# Patient Record
Sex: Female | Born: 1960 | Race: Black or African American | Hispanic: No | Marital: Married | Smoking: Never smoker
Health system: Southern US, Community
[De-identification: ages and names within clinical notes are randomized; demographics above are authoritative.]

## PROBLEM LIST (undated history)

## (undated) DIAGNOSIS — W19XXXA Unspecified fall, initial encounter: Secondary | ICD-10-CM

## (undated) DIAGNOSIS — E042 Nontoxic multinodular goiter: Secondary | ICD-10-CM

## (undated) DIAGNOSIS — J309 Allergic rhinitis, unspecified: Secondary | ICD-10-CM

## (undated) DIAGNOSIS — R079 Chest pain, unspecified: Secondary | ICD-10-CM

## (undated) DIAGNOSIS — M542 Cervicalgia: Secondary | ICD-10-CM

## (undated) DIAGNOSIS — S0003XA Contusion of scalp, initial encounter: Secondary | ICD-10-CM

## (undated) DIAGNOSIS — M1990 Unspecified osteoarthritis, unspecified site: Secondary | ICD-10-CM

## (undated) DIAGNOSIS — R21 Rash and other nonspecific skin eruption: Secondary | ICD-10-CM

## (undated) DIAGNOSIS — S1093XA Contusion of unspecified part of neck, initial encounter: Secondary | ICD-10-CM

## (undated) DIAGNOSIS — M79609 Pain in unspecified limb: Secondary | ICD-10-CM

## (undated) DIAGNOSIS — D219 Benign neoplasm of connective and other soft tissue, unspecified: Secondary | ICD-10-CM

## (undated) DIAGNOSIS — L299 Pruritus, unspecified: Secondary | ICD-10-CM

## (undated) DIAGNOSIS — S0083XA Contusion of other part of head, initial encounter: Secondary | ICD-10-CM

## (undated) DIAGNOSIS — M79606 Pain in leg, unspecified: Secondary | ICD-10-CM

## (undated) DIAGNOSIS — E039 Hypothyroidism, unspecified: Secondary | ICD-10-CM

## (undated) DIAGNOSIS — I10 Essential (primary) hypertension: Secondary | ICD-10-CM

## (undated) DIAGNOSIS — M545 Low back pain, unspecified: Secondary | ICD-10-CM

## (undated) DIAGNOSIS — E559 Vitamin D deficiency, unspecified: Secondary | ICD-10-CM

## (undated) HISTORY — DX: Low back pain, unspecified: M54.50

## (undated) HISTORY — DX: Allergic rhinitis, unspecified: J30.9

## (undated) HISTORY — DX: Benign neoplasm of connective and other soft tissue, unspecified: D21.9

## (undated) HISTORY — DX: Contusion of scalp, initial encounter: S10.93XA

## (undated) HISTORY — DX: Cervicalgia: M54.2

## (undated) HISTORY — DX: Pain in unspecified limb: M79.609

## (undated) HISTORY — DX: Pain in leg, unspecified: M79.606

## (undated) HISTORY — DX: Unspecified fall, initial encounter: W19.XXXA

## (undated) HISTORY — DX: Contusion of scalp, initial encounter: S00.03XA

## (undated) HISTORY — DX: Essential (primary) hypertension: I10

## (undated) HISTORY — DX: Vitamin D deficiency, unspecified: E55.9

## (undated) HISTORY — DX: Contusion of other part of head, initial encounter: S00.83XA

## (undated) HISTORY — DX: Chest pain, unspecified: R07.9

## (undated) HISTORY — DX: Unspecified osteoarthritis, unspecified site: M19.90

## (undated) HISTORY — DX: Nontoxic multinodular goiter: E04.2

## (undated) HISTORY — DX: Rash and other nonspecific skin eruption: R21

## (undated) HISTORY — DX: Hypothyroidism, unspecified: E03.9

## (undated) HISTORY — DX: Low back pain: M54.5

## (undated) HISTORY — DX: Pruritus, unspecified: L29.9

---

## 1997-03-25 HISTORY — PX: ABDOMINAL HYSTERECTOMY: SHX81

## 2003-06-25 ENCOUNTER — Emergency Department (HOSPITAL_COMMUNITY): Admission: AD | Admit: 2003-06-25 | Discharge: 2003-06-25 | Payer: Self-pay | Admitting: Internal Medicine

## 2004-09-20 ENCOUNTER — Ambulatory Visit: Payer: Self-pay | Admitting: Internal Medicine

## 2004-12-24 ENCOUNTER — Other Ambulatory Visit: Admission: RE | Admit: 2004-12-24 | Discharge: 2004-12-24 | Payer: Self-pay | Admitting: Obstetrics and Gynecology

## 2005-01-11 ENCOUNTER — Ambulatory Visit: Payer: Self-pay | Admitting: Internal Medicine

## 2005-01-15 ENCOUNTER — Ambulatory Visit: Payer: Self-pay | Admitting: Internal Medicine

## 2005-01-17 ENCOUNTER — Ambulatory Visit (HOSPITAL_COMMUNITY): Admission: RE | Admit: 2005-01-17 | Discharge: 2005-01-17 | Payer: Self-pay | Admitting: Internal Medicine

## 2005-03-19 ENCOUNTER — Ambulatory Visit: Payer: Self-pay | Admitting: Internal Medicine

## 2005-04-17 ENCOUNTER — Encounter: Admission: RE | Admit: 2005-04-17 | Discharge: 2005-04-17 | Payer: Self-pay | Admitting: Endocrinology

## 2005-04-17 ENCOUNTER — Encounter (INDEPENDENT_AMBULATORY_CARE_PROVIDER_SITE_OTHER): Payer: Self-pay | Admitting: *Deleted

## 2005-04-17 ENCOUNTER — Other Ambulatory Visit: Admission: RE | Admit: 2005-04-17 | Discharge: 2005-04-17 | Payer: Self-pay | Admitting: Diagnostic Radiology

## 2006-01-24 ENCOUNTER — Ambulatory Visit: Payer: Self-pay | Admitting: Internal Medicine

## 2006-05-26 ENCOUNTER — Ambulatory Visit: Payer: Self-pay | Admitting: Internal Medicine

## 2006-06-02 ENCOUNTER — Ambulatory Visit: Payer: Self-pay | Admitting: Internal Medicine

## 2006-06-02 LAB — CONVERTED CEMR LAB
ALT: 17 units/L (ref 0–40)
AST: 23 units/L (ref 0–37)
Albumin: 3.3 g/dL — ABNORMAL LOW (ref 3.5–5.2)
Alkaline Phosphatase: 54 units/L (ref 39–117)
Basophils Absolute: 0 10*3/uL (ref 0.0–0.1)
Basophils Relative: 0.1 % (ref 0.0–1.0)
Bilirubin Urine: NEGATIVE
Bilirubin, Direct: 0.1 mg/dL (ref 0.0–0.3)
Cholesterol: 165 mg/dL (ref 0–200)
Direct LDL: 98.4 mg/dL
Eosinophils Absolute: 0.1 10*3/uL (ref 0.0–0.6)
Eosinophils Relative: 2.6 % (ref 0.0–5.0)
HCT: 39.5 % (ref 36.0–46.0)
HDL: 52.6 mg/dL (ref >39.0)
Hemoglobin, Urine: NEGATIVE
Hemoglobin: 13.6 g/dL (ref 12.0–15.0)
Ketones, ur: NEGATIVE mg/dL
LDL Cholesterol: 107 mg/dL — ABNORMAL HIGH (ref 0–99)
Leukocytes, UA: NEGATIVE
Lymphocytes Relative: 35.4 % (ref 12.0–46.0)
MCHC: 34.4 g/dL (ref 30.0–36.0)
MCV: 87.8 fL (ref 78.0–100.0)
Monocytes Absolute: 0.5 10*3/uL (ref 0.2–0.7)
Monocytes Relative: 10.2 % (ref 3.0–11.0)
Neutro Abs: 2.8 10*3/uL (ref 1.4–7.7)
Neutrophils Relative %: 51.7 % (ref 43.0–77.0)
Nitrite: NEGATIVE
Platelets: 307 10*3/uL (ref 150–400)
RBC: 4.5 M/uL (ref 3.87–5.11)
RDW: 12.4 % (ref 11.5–14.6)
Specific Gravity, Urine: >=1.03 (ref 1.000–1.03)
Total Bilirubin: 0.5 mg/dL (ref 0.3–1.2)
Total CHOL/HDL Ratio: 3.1
Total Protein, Urine: NEGATIVE mg/dL
Total Protein: 6.5 g/dL (ref 6.0–8.3)
Triglycerides: 29 mg/dL (ref 0–149)
Urine Glucose: NEGATIVE mg/dL
Urobilinogen, UA: 0.2 (ref 0.0–1.0)
VLDL: 6 mg/dL (ref 0–40)
WBC: 5.3 10*3/uL (ref 4.5–10.5)
pH: 5.5 (ref 5.0–8.0)

## 2006-08-06 ENCOUNTER — Other Ambulatory Visit: Admission: RE | Admit: 2006-08-06 | Discharge: 2006-08-06 | Payer: Self-pay | Admitting: Obstetrics & Gynecology

## 2006-09-29 ENCOUNTER — Encounter: Admission: RE | Admit: 2006-09-29 | Discharge: 2006-09-29 | Payer: Self-pay | Admitting: Internal Medicine

## 2006-10-02 ENCOUNTER — Encounter: Admission: RE | Admit: 2006-10-02 | Discharge: 2006-10-02 | Payer: Self-pay | Admitting: Internal Medicine

## 2006-10-08 ENCOUNTER — Ambulatory Visit: Payer: Self-pay | Admitting: Internal Medicine

## 2006-11-10 ENCOUNTER — Ambulatory Visit: Payer: Self-pay | Admitting: Internal Medicine

## 2007-02-10 ENCOUNTER — Encounter: Payer: Self-pay | Admitting: Internal Medicine

## 2007-02-10 DIAGNOSIS — E042 Nontoxic multinodular goiter: Secondary | ICD-10-CM | POA: Insufficient documentation

## 2007-02-10 DIAGNOSIS — M542 Cervicalgia: Secondary | ICD-10-CM | POA: Insufficient documentation

## 2007-02-11 ENCOUNTER — Ambulatory Visit: Payer: Self-pay | Admitting: Internal Medicine

## 2007-02-11 LAB — CONVERTED CEMR LAB
AST: 19 units/L (ref 0–37)
Albumin: 4 g/dL (ref 3.5–5.2)
Basophils Absolute: 0 10*3/uL (ref 0.0–0.1)
Bilirubin, Direct: 0.1 mg/dL (ref 0.0–0.3)
Chloride: 108 meq/L (ref 96–112)
Cholesterol: 195 mg/dL (ref 0–200)
Eosinophils Absolute: 0.1 10*3/uL (ref 0.0–0.6)
Eosinophils Relative: 2.6 % (ref 0.0–5.0)
GFR calc Af Amer: 137 mL/min
GFR calc non Af Amer: 113 mL/min
Glucose, Bld: 93 mg/dL (ref 70–99)
HCT: 41.8 % (ref 36.0–46.0)
Lymphocytes Relative: 39 % (ref 12.0–46.0)
MCHC: 33.8 g/dL (ref 30.0–36.0)
MCV: 89.3 fL (ref 78.0–100.0)
Monocytes Absolute: 0.5 10*3/uL (ref 0.2–0.7)
Neutro Abs: 2.7 10*3/uL (ref 1.4–7.7)
Neutrophils Relative %: 49 % (ref 43.0–77.0)
Potassium: 4.1 meq/L (ref 3.5–5.1)
RBC: 4.69 M/uL (ref 3.87–5.11)
Sodium: 140 meq/L (ref 135–145)
TSH: 0.57 microintl units/mL (ref 0.35–5.50)
Total CHOL/HDL Ratio: 3
WBC: 5.4 10*3/uL (ref 4.5–10.5)

## 2007-04-02 ENCOUNTER — Encounter: Payer: Self-pay | Admitting: Internal Medicine

## 2007-04-03 ENCOUNTER — Ambulatory Visit: Payer: Self-pay | Admitting: Internal Medicine

## 2007-04-03 DIAGNOSIS — M79609 Pain in unspecified limb: Secondary | ICD-10-CM

## 2007-04-03 DIAGNOSIS — R079 Chest pain, unspecified: Secondary | ICD-10-CM

## 2007-04-03 DIAGNOSIS — E559 Vitamin D deficiency, unspecified: Secondary | ICD-10-CM

## 2007-05-08 ENCOUNTER — Telehealth: Payer: Self-pay | Admitting: Internal Medicine

## 2007-05-22 ENCOUNTER — Ambulatory Visit: Payer: Self-pay | Admitting: Internal Medicine

## 2007-06-13 ENCOUNTER — Emergency Department (HOSPITAL_COMMUNITY): Admission: EM | Admit: 2007-06-13 | Discharge: 2007-06-13 | Payer: Self-pay | Admitting: Family Medicine

## 2007-06-26 ENCOUNTER — Telehealth: Payer: Self-pay | Admitting: Internal Medicine

## 2007-06-29 ENCOUNTER — Telehealth: Payer: Self-pay | Admitting: Internal Medicine

## 2007-08-11 ENCOUNTER — Other Ambulatory Visit: Admission: RE | Admit: 2007-08-11 | Discharge: 2007-08-11 | Payer: Self-pay | Admitting: Obstetrics & Gynecology

## 2007-08-14 ENCOUNTER — Ambulatory Visit: Payer: Self-pay | Admitting: Internal Medicine

## 2007-08-14 DIAGNOSIS — L299 Pruritus, unspecified: Secondary | ICD-10-CM | POA: Insufficient documentation

## 2007-08-14 DIAGNOSIS — J309 Allergic rhinitis, unspecified: Secondary | ICD-10-CM | POA: Insufficient documentation

## 2007-08-14 DIAGNOSIS — R21 Rash and other nonspecific skin eruption: Secondary | ICD-10-CM | POA: Insufficient documentation

## 2007-10-13 ENCOUNTER — Telehealth: Payer: Self-pay | Admitting: Internal Medicine

## 2007-11-16 ENCOUNTER — Ambulatory Visit: Payer: Self-pay | Admitting: Internal Medicine

## 2007-11-18 ENCOUNTER — Telehealth: Payer: Self-pay | Admitting: Internal Medicine

## 2007-11-19 ENCOUNTER — Ambulatory Visit: Payer: Self-pay | Admitting: Internal Medicine

## 2007-11-19 DIAGNOSIS — M545 Low back pain, unspecified: Secondary | ICD-10-CM | POA: Insufficient documentation

## 2007-11-19 DIAGNOSIS — S0003XA Contusion of scalp, initial encounter: Secondary | ICD-10-CM | POA: Insufficient documentation

## 2007-11-19 DIAGNOSIS — S0083XA Contusion of other part of head, initial encounter: Secondary | ICD-10-CM

## 2007-11-19 DIAGNOSIS — S1093XA Contusion of unspecified part of neck, initial encounter: Secondary | ICD-10-CM

## 2008-02-11 ENCOUNTER — Telehealth: Payer: Self-pay | Admitting: Internal Medicine

## 2008-02-17 ENCOUNTER — Encounter: Admission: RE | Admit: 2008-02-17 | Discharge: 2008-02-17 | Payer: Self-pay | Admitting: Internal Medicine

## 2008-02-26 ENCOUNTER — Telehealth: Payer: Self-pay | Admitting: Internal Medicine

## 2008-02-29 ENCOUNTER — Ambulatory Visit: Payer: Self-pay | Admitting: Internal Medicine

## 2008-03-01 LAB — CONVERTED CEMR LAB
ALT: 17 units/L (ref 0–35)
AST: 22 units/L (ref 0–37)
BUN: 11 mg/dL (ref 6–23)
Basophils Absolute: 0 10*3/uL (ref 0.0–0.1)
Basophils Absolute: 0 10*3/uL (ref 0.0–0.1)
Basophils Relative: 0.2 % (ref 0.0–3.0)
Basophils Relative: 0.2 % (ref 0.0–3.0)
CO2: 27 meq/L (ref 19–32)
CO2: 27 meq/L (ref 19–32)
Chloride: 109 meq/L (ref 96–112)
Chloride: 109 meq/L (ref 96–112)
Creatinine, Ser: 0.6 mg/dL (ref 0.4–1.2)
Eosinophils Relative: 2.8 % (ref 0.0–5.0)
Glucose, Bld: 90 mg/dL (ref 70–99)
Glucose, Bld: 90 mg/dL (ref 70–99)
Hemoglobin: 13.9 g/dL (ref 12.0–15.0)
Ketones, ur: NEGATIVE mg/dL
Leukocytes, UA: NEGATIVE
Leukocytes, UA: NEGATIVE
Lipase: 30 units/L (ref 11.0–59.0)
Lymphocytes Relative: 34.2 % (ref 12.0–46.0)
Lymphocytes Relative: 34.2 % (ref 12.0–46.0)
MCHC: 34.1 g/dL (ref 30.0–36.0)
Monocytes Relative: 10.8 % (ref 3.0–12.0)
Monocytes Relative: 10.8 % (ref 3.0–12.0)
Neutrophils Relative %: 52 % (ref 43.0–77.0)
Neutrophils Relative %: 52 % (ref 43.0–77.0)
Nitrite: NEGATIVE
Nitrite: NEGATIVE
RBC: 4.69 M/uL (ref 3.87–5.11)
RBC: 4.69 M/uL (ref 3.87–5.11)
Sed Rate: 10 mm/hr (ref 0–22)
Sed Rate: 10 mm/hr (ref 0–22)
Sodium: 142 meq/L (ref 135–145)
Specific Gravity, Urine: 1.02 (ref 1.000–1.03)
Specific Gravity, Urine: 1.02 (ref 1.000–1.03)
Total Bilirubin: 0.7 mg/dL (ref 0.3–1.2)
Urobilinogen, UA: 0.2 (ref 0.0–1.0)
Urobilinogen, UA: 0.2 (ref 0.0–1.0)
WBC: 5.6 10*3/uL (ref 4.5–10.5)
pH: 6 (ref 5.0–8.0)
pH: 6 (ref 5.0–8.0)

## 2008-03-05 ENCOUNTER — Encounter: Payer: Self-pay | Admitting: Internal Medicine

## 2008-03-07 ENCOUNTER — Ambulatory Visit: Payer: Self-pay | Admitting: Endocrinology

## 2008-03-08 ENCOUNTER — Ambulatory Visit: Payer: Self-pay | Admitting: Endocrinology

## 2008-03-15 ENCOUNTER — Encounter (HOSPITAL_COMMUNITY): Admission: RE | Admit: 2008-03-15 | Discharge: 2008-03-22 | Payer: Self-pay | Admitting: Internal Medicine

## 2008-06-13 ENCOUNTER — Telehealth: Payer: Self-pay | Admitting: Internal Medicine

## 2008-06-17 ENCOUNTER — Encounter (INDEPENDENT_AMBULATORY_CARE_PROVIDER_SITE_OTHER): Payer: Self-pay | Admitting: Interventional Radiology

## 2008-06-17 ENCOUNTER — Encounter: Payer: Self-pay | Admitting: Endocrinology

## 2008-06-17 ENCOUNTER — Ambulatory Visit (HOSPITAL_COMMUNITY): Admission: RE | Admit: 2008-06-17 | Discharge: 2008-06-17 | Payer: Self-pay | Admitting: Endocrinology

## 2008-06-28 ENCOUNTER — Telehealth: Payer: Self-pay | Admitting: Endocrinology

## 2008-06-29 ENCOUNTER — Telehealth: Payer: Self-pay | Admitting: Endocrinology

## 2008-07-04 ENCOUNTER — Telehealth (INDEPENDENT_AMBULATORY_CARE_PROVIDER_SITE_OTHER): Payer: Self-pay | Admitting: *Deleted

## 2008-07-09 IMAGING — CR DG ABDOMEN ACUTE W/ 1V CHEST
3 series · 3 of 3 positions shown · non-contrast
Comparison: none

CLINICAL DATA: Chest pain.  Abdominal pain.  
ACUTE ABDOMINAL SERIES ? 3 VIEW:

[view not recorded (1 of 3)]
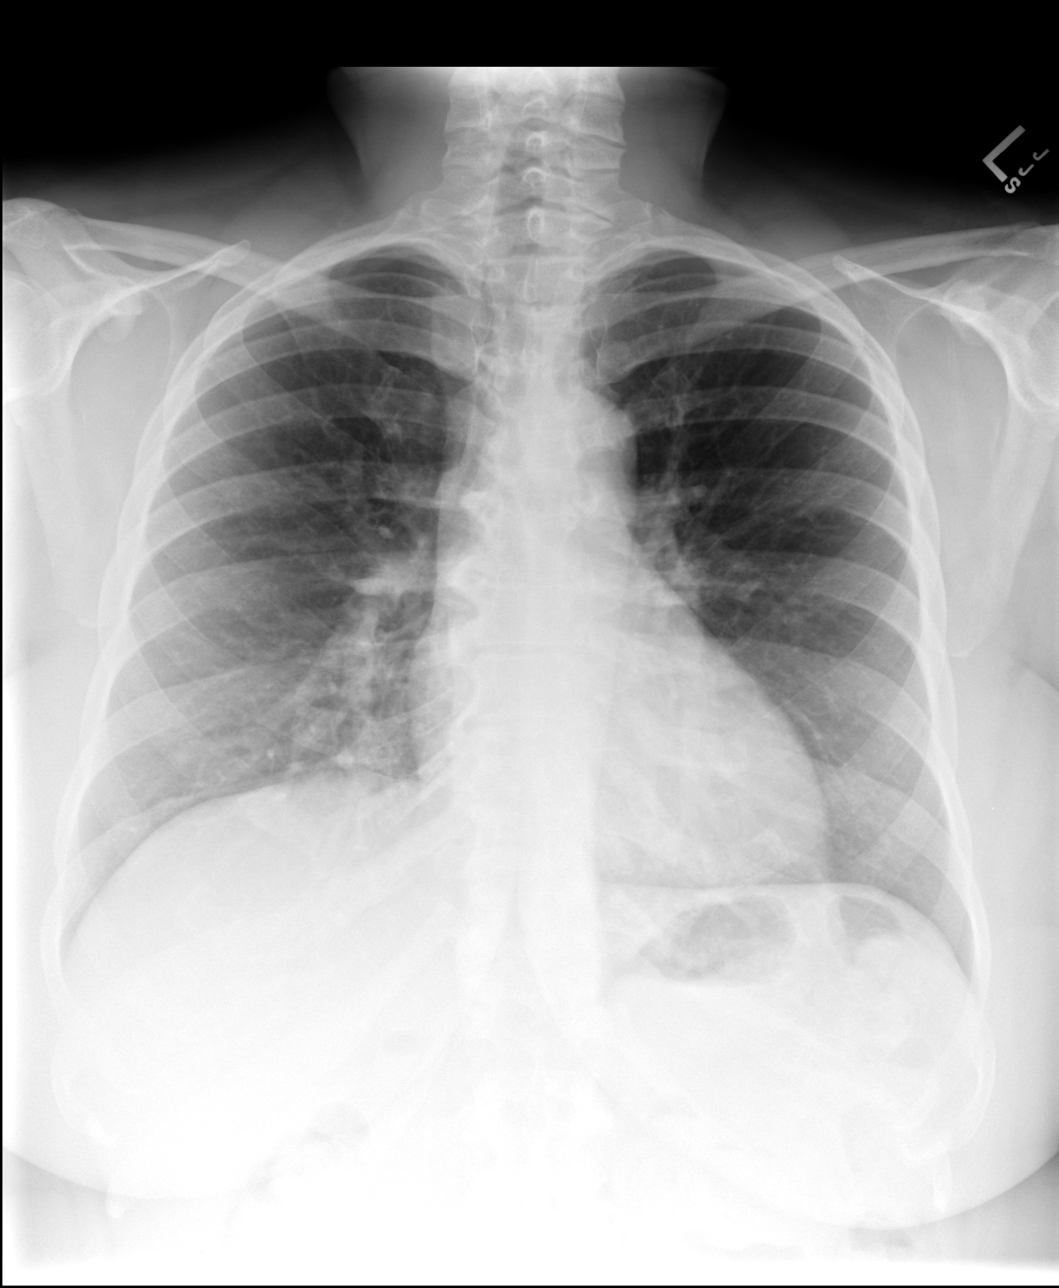

[view not recorded (2 of 3)]
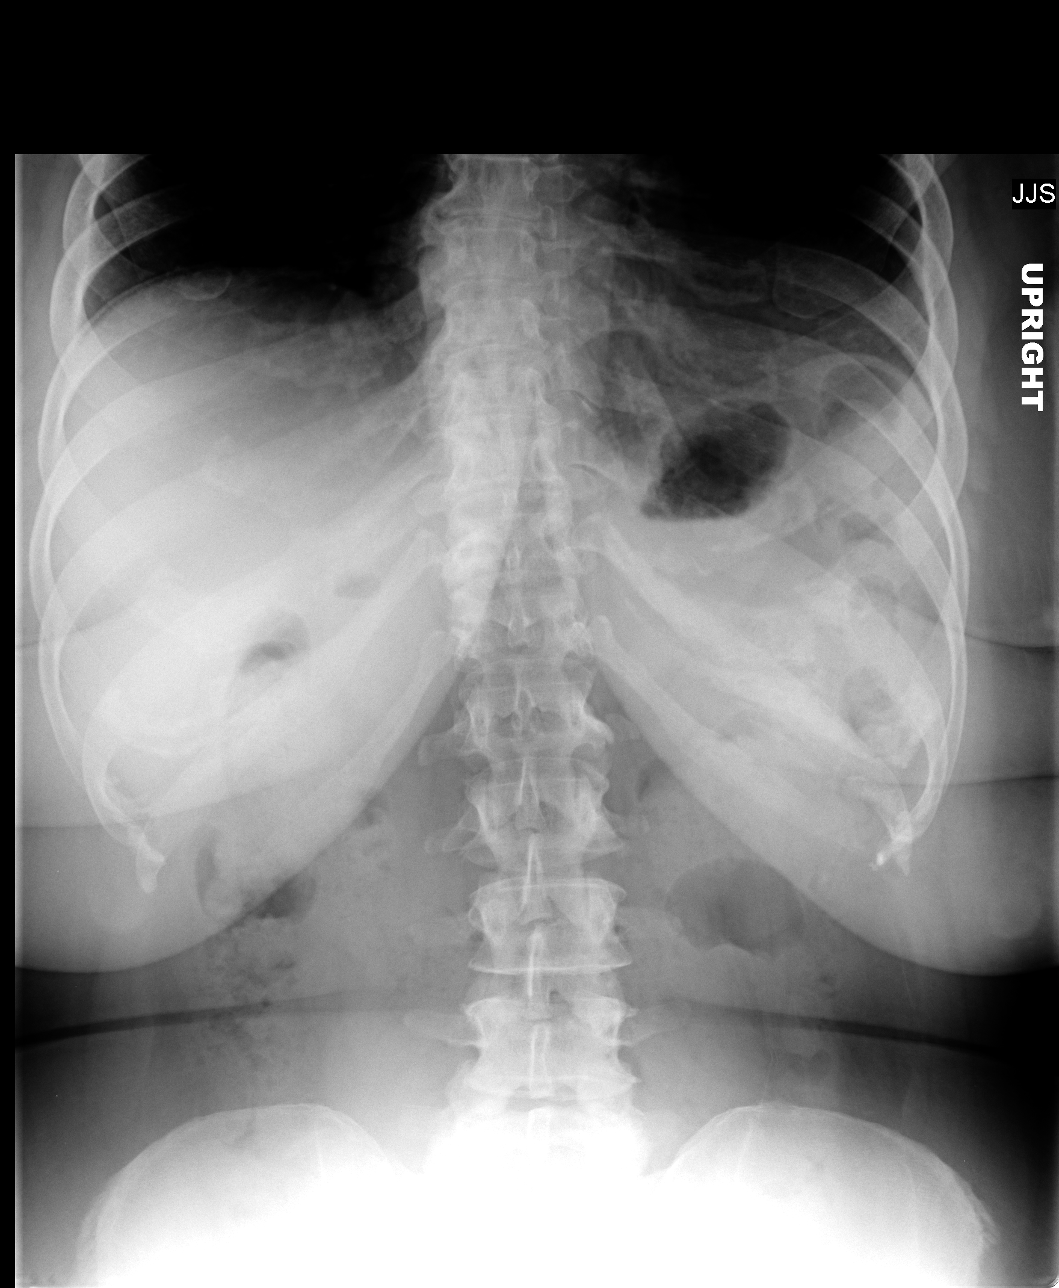

[view not recorded (3 of 3)]
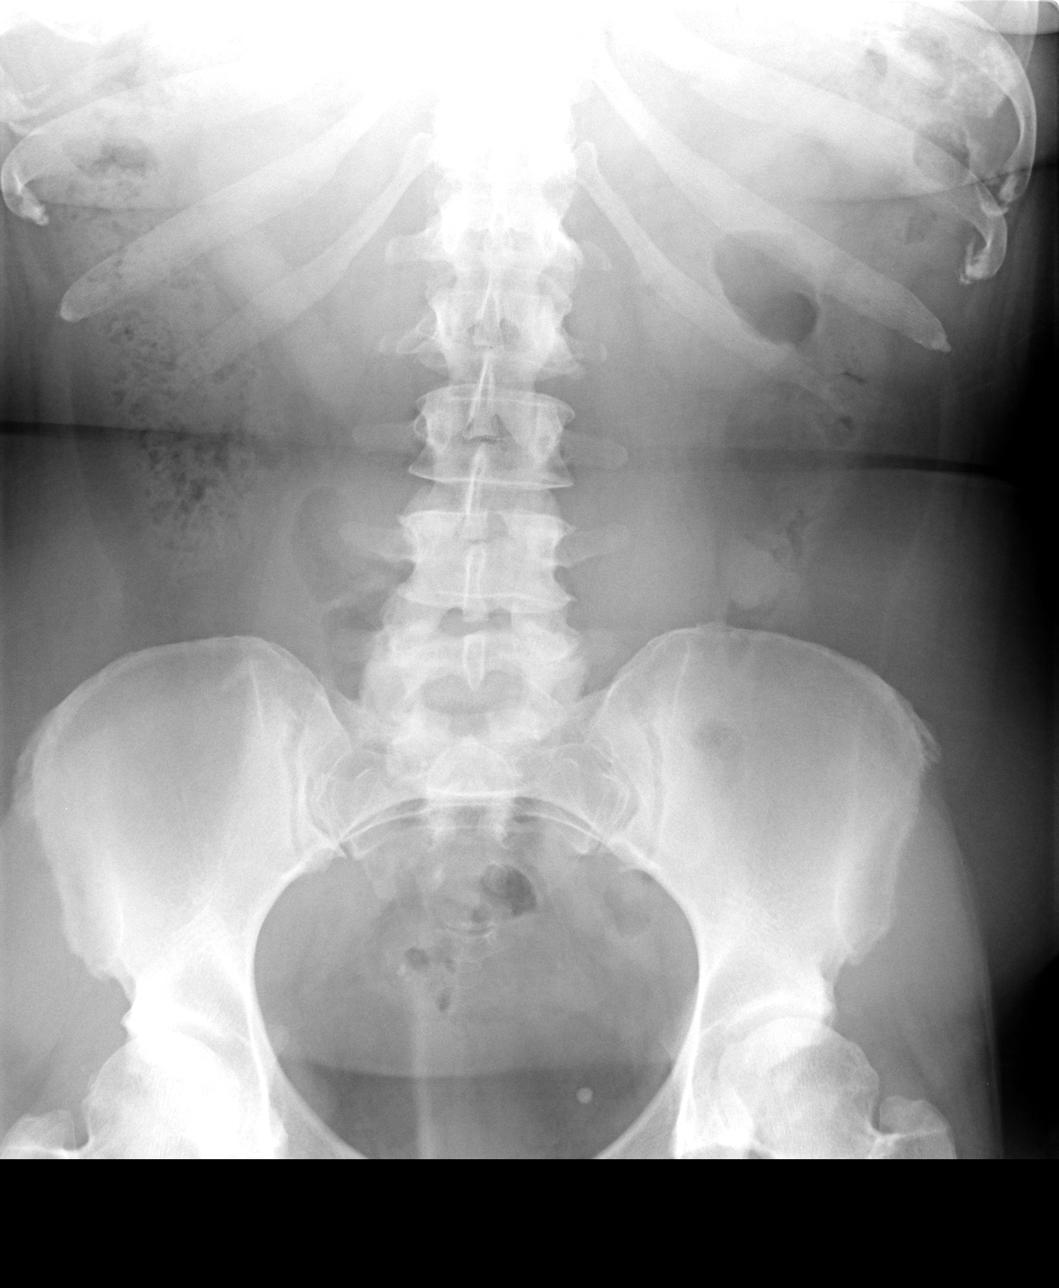

[3 of 3 positions shown; findings below may reference images not displayed]

FINDINGS: There is no evidence of active cardiopulmonary disease.  Fairly generous amount of stool in the colon.  No evidence of ileus or bowel obstruction.  Psoas muscle margins and properitoneal fat stripes are defined.  No unusual calcifications.  Incidental degenerative spondylotic changes involving the lower thoracic spine.  Minimal lower thoracic dextroscoliosis.
IMPRESSION: No acute chest or abdominal disease.  See comments above.

## 2008-08-05 ENCOUNTER — Encounter (HOSPITAL_COMMUNITY): Admission: RE | Admit: 2008-08-05 | Discharge: 2008-10-26 | Payer: Self-pay | Admitting: Endocrinology

## 2008-08-18 ENCOUNTER — Telehealth (INDEPENDENT_AMBULATORY_CARE_PROVIDER_SITE_OTHER): Payer: Self-pay | Admitting: *Deleted

## 2008-08-25 ENCOUNTER — Ambulatory Visit: Payer: Self-pay | Admitting: Internal Medicine

## 2008-08-25 ENCOUNTER — Encounter: Payer: Self-pay | Admitting: Internal Medicine

## 2008-08-25 LAB — CONVERTED CEMR LAB
ALT: 18 units/L (ref 0–35)
Alkaline Phosphatase: 79 units/L (ref 39–117)
BUN: 11 mg/dL (ref 6–23)
Basophils Absolute: 0 10*3/uL (ref 0.0–0.1)
Bilirubin Urine: NEGATIVE
Bilirubin, Direct: 0.1 mg/dL (ref 0.0–0.3)
Cholesterol: 192 mg/dL (ref 0–200)
Eosinophils Absolute: 0.1 10*3/uL (ref 0.0–0.7)
GFR calc non Af Amer: 136.1 mL/min (ref >60)
Glucose, Bld: 87 mg/dL (ref 70–99)
HCT: 41 % (ref 36.0–46.0)
Hemoglobin, Urine: NEGATIVE
Lymphs Abs: 1.5 10*3/uL (ref 0.7–4.0)
MCV: 86 fL (ref 78.0–100.0)
Monocytes Absolute: 0.4 10*3/uL (ref 0.1–1.0)
Monocytes Relative: 9.5 % (ref 3.0–12.0)
Platelets: 297 10*3/uL (ref 150.0–400.0)
Potassium: 4.2 meq/L (ref 3.5–5.1)
RDW: 12.9 % (ref 11.5–14.6)
Total Protein, Urine: NEGATIVE mg/dL
Total Protein: 7.1 g/dL (ref 6.0–8.3)
Urine Glucose: NEGATIVE mg/dL

## 2008-09-30 ENCOUNTER — Ambulatory Visit: Payer: Self-pay | Admitting: Internal Medicine

## 2008-09-30 ENCOUNTER — Ambulatory Visit: Payer: Self-pay | Admitting: Endocrinology

## 2008-09-30 DIAGNOSIS — R51 Headache: Secondary | ICD-10-CM

## 2008-09-30 DIAGNOSIS — R519 Headache, unspecified: Secondary | ICD-10-CM | POA: Insufficient documentation

## 2008-10-07 ENCOUNTER — Encounter: Payer: Self-pay | Admitting: Cardiology

## 2008-10-10 LAB — CONVERTED CEMR LAB: TSH: 0.56 microintl units/mL (ref 0.35–5.50)

## 2008-10-12 ENCOUNTER — Encounter: Payer: Self-pay | Admitting: Internal Medicine

## 2008-11-03 ENCOUNTER — Telehealth: Payer: Self-pay | Admitting: Internal Medicine

## 2009-01-05 ENCOUNTER — Ambulatory Visit: Payer: Self-pay | Admitting: Endocrinology

## 2009-01-05 DIAGNOSIS — E0590 Thyrotoxicosis, unspecified without thyrotoxic crisis or storm: Secondary | ICD-10-CM | POA: Insufficient documentation

## 2009-01-06 ENCOUNTER — Telehealth: Payer: Self-pay | Admitting: Internal Medicine

## 2009-01-11 ENCOUNTER — Ambulatory Visit: Payer: Self-pay | Admitting: Endocrinology

## 2009-01-12 LAB — CONVERTED CEMR LAB: TSH: 0.61 microintl units/mL (ref 0.35–5.50)

## 2009-03-13 ENCOUNTER — Encounter: Payer: Self-pay | Admitting: Internal Medicine

## 2009-03-25 DIAGNOSIS — I10 Essential (primary) hypertension: Secondary | ICD-10-CM

## 2009-03-25 DIAGNOSIS — E039 Hypothyroidism, unspecified: Secondary | ICD-10-CM

## 2009-03-25 HISTORY — DX: Hypothyroidism, unspecified: E03.9

## 2009-03-25 HISTORY — DX: Essential (primary) hypertension: I10

## 2009-05-05 ENCOUNTER — Telehealth: Payer: Self-pay | Admitting: Internal Medicine

## 2009-05-05 ENCOUNTER — Ambulatory Visit: Payer: Self-pay | Admitting: Endocrinology

## 2009-05-16 ENCOUNTER — Ambulatory Visit: Payer: Self-pay | Admitting: Internal Medicine

## 2009-05-18 LAB — CONVERTED CEMR LAB
ALT: 65 units/L — ABNORMAL HIGH (ref 0–35)
AST: 57 units/L — ABNORMAL HIGH (ref 0–37)
Alkaline Phosphatase: 164 units/L — ABNORMAL HIGH (ref 39–117)
Bilirubin, Direct: 0.2 mg/dL (ref 0.0–0.3)
CO2: 28 meq/L (ref 19–32)
Chloride: 108 meq/L (ref 96–112)
Creatinine, Ser: 0.4 mg/dL (ref 0.4–1.2)
Eosinophils Relative: 1.7 % (ref 0.0–5.0)
HCT: 37.5 % (ref 36.0–46.0)
Hemoglobin: 12.5 g/dL (ref 12.0–15.0)
LDL Cholesterol: 86 mg/dL (ref 0–99)
Leukocytes, UA: NEGATIVE
Lymphocytes Relative: 39.8 % (ref 12.0–46.0)
Lymphs Abs: 1.2 10*3/uL (ref 0.7–4.0)
Monocytes Relative: 21.7 % — ABNORMAL HIGH (ref 3.0–12.0)
Nitrite: NEGATIVE
Platelets: 305 10*3/uL (ref 150.0–400.0)
Potassium: 4.2 meq/L (ref 3.5–5.1)
Specific Gravity, Urine: <=1.005 (ref 1.000–1.030)
TSH: 0.12 microintl units/mL — ABNORMAL LOW (ref 0.35–5.50)
Total CHOL/HDL Ratio: 3
Total Protein: 6.9 g/dL (ref 6.0–8.3)
Triglycerides: 101 mg/dL (ref 0.0–149.0)
Urobilinogen, UA: 0.2 (ref 0.0–1.0)
WBC: 3.2 10*3/uL — ABNORMAL LOW (ref 4.5–10.5)
pH: 5.5 (ref 5.0–8.0)

## 2009-05-19 ENCOUNTER — Telehealth: Payer: Self-pay | Admitting: Internal Medicine

## 2009-05-19 ENCOUNTER — Telehealth: Payer: Self-pay | Admitting: Endocrinology

## 2009-06-08 ENCOUNTER — Encounter (HOSPITAL_COMMUNITY): Admission: RE | Admit: 2009-06-08 | Discharge: 2009-09-06 | Payer: Self-pay | Admitting: Endocrinology

## 2009-06-08 ENCOUNTER — Telehealth: Payer: Self-pay | Admitting: Endocrinology

## 2009-06-12 ENCOUNTER — Telehealth: Payer: Self-pay | Admitting: Endocrinology

## 2009-06-30 ENCOUNTER — Ambulatory Visit (HOSPITAL_COMMUNITY): Admission: RE | Admit: 2009-06-30 | Discharge: 2009-06-30 | Payer: Self-pay | Admitting: Endocrinology

## 2009-08-17 ENCOUNTER — Ambulatory Visit: Payer: Self-pay | Admitting: Endocrinology

## 2009-08-17 DIAGNOSIS — K769 Liver disease, unspecified: Secondary | ICD-10-CM | POA: Insufficient documentation

## 2009-08-25 ENCOUNTER — Ambulatory Visit: Payer: Self-pay | Admitting: Endocrinology

## 2009-08-25 ENCOUNTER — Telehealth: Payer: Self-pay | Admitting: Internal Medicine

## 2009-08-25 LAB — CONVERTED CEMR LAB
Alkaline Phosphatase: 187 units/L — ABNORMAL HIGH (ref 39–117)
Bilirubin, Direct: 0.1 mg/dL (ref 0.0–0.3)

## 2009-09-13 ENCOUNTER — Telehealth: Payer: Self-pay | Admitting: Internal Medicine

## 2009-10-16 ENCOUNTER — Ambulatory Visit: Payer: Self-pay | Admitting: Internal Medicine

## 2009-10-16 DIAGNOSIS — R030 Elevated blood-pressure reading, without diagnosis of hypertension: Secondary | ICD-10-CM

## 2009-10-16 DIAGNOSIS — G4700 Insomnia, unspecified: Secondary | ICD-10-CM

## 2009-10-16 DIAGNOSIS — F341 Dysthymic disorder: Secondary | ICD-10-CM

## 2009-10-31 ENCOUNTER — Telehealth: Payer: Self-pay | Admitting: Internal Medicine

## 2009-10-31 ENCOUNTER — Ambulatory Visit: Payer: Self-pay | Admitting: Internal Medicine

## 2009-11-16 ENCOUNTER — Ambulatory Visit: Payer: Self-pay | Admitting: Internal Medicine

## 2009-11-30 ENCOUNTER — Telehealth: Payer: Self-pay | Admitting: Internal Medicine

## 2009-11-30 ENCOUNTER — Ambulatory Visit: Payer: Self-pay | Admitting: Internal Medicine

## 2009-12-07 ENCOUNTER — Encounter: Payer: Self-pay | Admitting: Internal Medicine

## 2009-12-07 ENCOUNTER — Telehealth: Payer: Self-pay | Admitting: Internal Medicine

## 2009-12-12 ENCOUNTER — Telehealth: Payer: Self-pay | Admitting: Internal Medicine

## 2009-12-15 ENCOUNTER — Ambulatory Visit: Payer: Self-pay | Admitting: Internal Medicine

## 2010-02-08 ENCOUNTER — Ambulatory Visit: Payer: Self-pay | Admitting: Endocrinology

## 2010-03-01 ENCOUNTER — Encounter: Payer: Self-pay | Admitting: Internal Medicine

## 2010-03-01 ENCOUNTER — Ambulatory Visit: Payer: Self-pay | Admitting: Internal Medicine

## 2010-03-02 LAB — CONVERTED CEMR LAB: Vit D, 25-Hydroxy: 14 ng/mL — ABNORMAL LOW (ref 30–89)

## 2010-03-06 ENCOUNTER — Telehealth: Payer: Self-pay | Admitting: Internal Medicine

## 2010-03-06 LAB — CONVERTED CEMR LAB
ALT: 15 units/L (ref 0–35)
AST: 20 units/L (ref 0–37)
Albumin: 4 g/dL (ref 3.5–5.2)
Alkaline Phosphatase: 109 units/L (ref 39–117)
BUN: 11 mg/dL (ref 6–23)
Basophils Absolute: 0 10*3/uL (ref 0.0–0.1)
Basophils Relative: 0.4 % (ref 0.0–3.0)
Bilirubin Urine: NEGATIVE
Bilirubin, Direct: 0.1 mg/dL (ref 0.0–0.3)
CO2: 29 meq/L (ref 19–32)
Calcium: 9.3 mg/dL (ref 8.4–10.5)
Chloride: 110 meq/L (ref 96–112)
Creatinine, Ser: 0.6 mg/dL (ref 0.4–1.2)
Eosinophils Absolute: 0.1 10*3/uL (ref 0.0–0.7)
Eosinophils Relative: 2 % (ref 0.0–5.0)
GFR calc non Af Amer: 132.72 mL/min (ref >60.00)
Glucose, Bld: 85 mg/dL (ref 70–99)
HCT: 38.4 % (ref 36.0–46.0)
Hemoglobin: 13.1 g/dL (ref 12.0–15.0)
Ketones, ur: NEGATIVE mg/dL
Leukocytes, UA: NEGATIVE
Lymphocytes Relative: 38 % (ref 12.0–46.0)
Lymphs Abs: 1.7 10*3/uL (ref 0.7–4.0)
MCHC: 34.1 g/dL (ref 30.0–36.0)
MCV: 89.2 fL (ref 78.0–100.0)
Monocytes Absolute: 0.4 10*3/uL (ref 0.1–1.0)
Monocytes Relative: 8 % (ref 3.0–12.0)
Neutro Abs: 2.4 10*3/uL (ref 1.4–7.7)
Neutrophils Relative %: 51.6 % (ref 43.0–77.0)
Nitrite: NEGATIVE
Platelets: 272 10*3/uL (ref 150.0–400.0)
Potassium: 4.8 meq/L (ref 3.5–5.1)
RBC: 4.31 M/uL (ref 3.87–5.11)
RDW: 14.2 % (ref 11.5–14.6)
Sodium: 146 meq/L — ABNORMAL HIGH (ref 135–145)
Specific Gravity, Urine: 1.015 (ref 1.000–1.030)
TSH: 15.69 microintl units/mL — ABNORMAL HIGH (ref 0.35–5.50)
Total Bilirubin: 0.8 mg/dL (ref 0.3–1.2)
Total Protein, Urine: NEGATIVE mg/dL
Total Protein: 6.8 g/dL (ref 6.0–8.3)
Urine Glucose: NEGATIVE mg/dL
Urobilinogen, UA: 0.2 (ref 0.0–1.0)
WBC: 4.6 10*3/uL (ref 4.5–10.5)
pH: 7 (ref 5.0–8.0)

## 2010-03-13 ENCOUNTER — Ambulatory Visit: Payer: Self-pay | Admitting: Internal Medicine

## 2010-03-13 DIAGNOSIS — E039 Hypothyroidism, unspecified: Secondary | ICD-10-CM | POA: Insufficient documentation

## 2010-03-13 DIAGNOSIS — R609 Edema, unspecified: Secondary | ICD-10-CM | POA: Insufficient documentation

## 2010-03-22 ENCOUNTER — Encounter: Payer: Self-pay | Admitting: Internal Medicine

## 2010-04-10 ENCOUNTER — Encounter: Payer: Self-pay | Admitting: Internal Medicine

## 2010-04-13 ENCOUNTER — Ambulatory Visit
Admission: RE | Admit: 2010-04-13 | Discharge: 2010-04-13 | Payer: Self-pay | Source: Home / Self Care | Attending: Internal Medicine | Admitting: Internal Medicine

## 2010-04-13 DIAGNOSIS — I10 Essential (primary) hypertension: Secondary | ICD-10-CM | POA: Insufficient documentation

## 2010-04-16 ENCOUNTER — Encounter: Payer: Self-pay | Admitting: Internal Medicine

## 2010-04-24 NOTE — Progress Notes (Signed)
----   Converted from flag ---- ---- 06/12/2009 9:57 AM, Shelbie Proctor wrote: Dr Lorane Gell pt wanted to know why she need this again (1-131Therapy) pt states she had this done . is this a repeat  pls advise ------------------------------  please call pt.  yes, this is a repeat, which approx 1/10 people need Jini Horiuchi, md  Appended Document:  Attempted to call patient, no answer/no machine.  Appended Document:  I informed pt of above, but pt wants to know from MD the specific reason she needs to repeat this as she had done it 4 days ago. please advise  Appended Document:  i called pt 06/13/09 please do i-131 rx,a nd ret 6 weeks later

## 2010-04-24 NOTE — Miscellaneous (Signed)
Summary: Orders Update  Clinical Lists Changes  Orders: Added new Test order of TLB-CBC Platelet - w/Differential (85025-CBCD) - Signed Added new Test order of TLB-TSH (Thyroid Stimulating Hormone) (84443-TSH) - Signed Added new Test order of TLB-BMP (Basic Metabolic Panel-BMET) (80048-METABOL) - Signed Added new Test order of TLB-Hepatic/Liver Function Pnl (80076-HEPATIC) - Signed Added new Test order of TLB-Lipid Panel (80061-LIPID) - Signed Added new Test order of TLB-Udip ONLY (81003-UDIP) - Signed

## 2010-04-24 NOTE — Letter (Signed)
Summary: Out of Work  [facility]  [address] [place], [place] [address]   Phone: [phone]  Fax: [phone]    November 30, 2009   Employee:  [name]    To Whom It May Concern:   For Medical reasons, please excuse the above named employee from work for the following dates:  Start:   10/31/2009  End:   12/08/2009  If you need additional information, please feel free to contact our office.         Sincerely,    Lanier Prude, [facility])

## 2010-04-24 NOTE — Progress Notes (Signed)
Summary: Sinus infection  Phone Note Other Incoming   Caller: pt - [phone] Summary of Call: Pt has a terrible sinus infection. she [facility] [address]. Please Advise Initial call taken by: Ami Bullins CMA,  September 13, 2009 9:06 AM  Follow-up for Phone Call        Zpac Use over-the-counter medicines for "cold": Tylenol  650mg  or Advil 400mg  every 6 hours  for fever; Delsym or Robutussin for cough. Mucinex or Mucinex D for congestion. Ricola or Halls for sore throat. Office visit if not better or if worse.  Follow-up by: Tresa Garter MD,  September 13, 2009 1:06 PM    New/Updated Medications: ZITHROMAX Z-PAK 250 MG TABS (AZITHROMYCIN) as dirrected Prescriptions: ZITHROMAX Z-PAK 250 MG TABS (AZITHROMYCIN) as dirrected  #1 x 0   Entered and Authorized by:   Tresa Garter MD   Signed by:   Lucious Groves on 09/13/2009   Method used:   [facility] to        [facility]  [address] [id number]       [address]       [place], [place]  [address]       Ph: [phone]       Fax: [phone]   RxID:   [id number]

## 2010-04-24 NOTE — Letter (Signed)
Summary: Out of Work  [facility]  [address] [place], [place] [address]   Phone: [phone]  Fax: [phone]    November 30, 2009   Employee:  [name]    To Whom It May Concern:   For Medical reasons, please excuse the above named employee from work for the following dates:  Start:   10/31/09  End:   11/07/09  If you need additional information, please feel free to contact our office.         Sincerely,    Tresa Garter MD

## 2010-04-24 NOTE — Letter (Signed)
Summary: Out of Work  [facility]  [address] [place], [place] [address]   Phone: [phone]  Fax: [phone]    December 07, 2009   Employee:  [name]    To Whom It May Concern:   For Medical reasons, please excuse the above named employee from work for the following dates:  Start:   10/16/2009  End:   12/18/2009  If you need additional information, please feel free to contact our office.         Sincerely,    Lanier Prude, [facility]) for Dr. Posey Rea

## 2010-04-24 NOTE — Progress Notes (Signed)
Summary: pt request a call  Phone Note Call from Patient   Caller: Patient [phone] Summary of Call: pt called and left a message on Triage requesting a call from SAE or his nurse "immediately!". I returned pt's call and pt stated that she would not speak with me, she had questions about a procedure she is scheduled to have tomorrow. I advised pt that both MD and his nurse were with pts but I can help her. Pt refused, at which time I requested to put her on hold to try and located nurse. I came back to the line a short time after and pt was not there. I forwared pt request to nurse. Initial call taken by: Margaret Pyle, CMA,  June 08, 2009 9:26 AM  Follow-up for Phone Call        I called pt at the requested number [phone] and left a message to return my call. I also called her home number no answer or answering machine, and I called her work number and pt is off today. Follow-up by: Josph Macho RMA,  June 08, 2009 9:28 AM  Additional Follow-up for Phone Call Additional follow up Details #1::        pt states she went and did the first part of the I-31 of swallowing dye and goes back tomorrow for the rest of the procedure and states they told her they would be sticking a needle in her thyroid and she doesn't remember that being done in 2009. Pt wants to make sure MD scheduled her for the proper test.  I also informed her that it was okay to leave a message with triage and triage would relay the message and she would of had an answer by now. She stated she will not talk to triage its none of there business whats going on with her. I tried to explain that was triages job to answer questions and get answers for the patients. She continued to say she would not give any information to triage besides for the MD or his nurse to call her back. I informed her I would note that. Additional Follow-up by: Josph Macho RMA,  June 08, 2009 12:08 PM    Additional Follow-up for Phone  Call Additional follow up Details #2::    i called pt 06/08/09.  the test does not involve any needles. Follow-up by: Minus Breeding MD,  June 09, 2009 7:39 PM

## 2010-04-24 NOTE — Progress Notes (Signed)
Summary: RESULTS  Phone Note Call from Patient Call back at Work Phone [phone] Call back at [phone]   Summary of Call: Patient is requesting results of last labs from [facility]. Also feels that Dr Posey Rea will want her to do more labs. Please advise.  Initial call taken by: Lamar Sprinkles, CMA,  August 25, 2009 3:50 PM  Follow-up for Phone Call        The TSH is still abnormal reflecting thyroid hyperactivity. Dr Everardo All will instruct when to recheck. Add TSH, BMET, CBC, UA, LFT to the next labs 995.20  Follow-up by: Tresa Garter MD,  August 25, 2009 4:14 PM  Additional Follow-up for Phone Call Additional follow up Details #1::        Pt notified and will await recommendations from [facility] before scheduling. Dr Everardo All, any recommendations for patient? Additional Follow-up by: Lucious Groves,  August 25, 2009 4:37 PM    Additional Follow-up for Phone Call Additional follow up Details #2::    thyroid needs more time to get better.  please ret as scheduled.  i have referred liver panel to dr Maribel Hadley. Follow-up by: Minus Breeding MD,  August 27, 2009 8:41 AM  Additional Follow-up for Phone Call Additional follow up Details #3:: Details for Additional Follow-up Action Taken: Patient notified. Additional Follow-up by: Lucious Groves,  August 28, 2009 9:42 AM

## 2010-04-24 NOTE — Progress Notes (Signed)
Summary: WORK EXTENSION  Phone Note Call from Patient Call back at Home Phone [phone]   Summary of Call: Patient is requesting a call back. She is requesting extension of time off work. Please advise, does pt need office visit? Pt wants extended to 01/22/10 Initial call taken by: Lamar Sprinkles, CMA,  December 12, 2009 2:57 PM  Follow-up for Phone Call        ov before last work excuse expired Follow-up by: Tresa Garter MD,  December 12, 2009 5:30 PM  Additional Follow-up for Phone Call Additional follow up Details #1::        Pt informed, transferred to scheduler for apt Additional Follow-up by: Lamar Sprinkles, CMA,  December 13, 2009 3:52 PM

## 2010-04-24 NOTE — Letter (Signed)
Summary: Out of Work  [facility]  [address] [place], [place] [address]   Phone: [phone]  Fax: [phone]    October 16, 2009   Employee:  [name]    To Whom It May Concern:   For Medical reasons, please excuse the above named employee from work for the following dates:  Start:   10/16/09  End: 10/30/09    If you need additional information, please feel free to contact our office.         Sincerely,    Tresa Garter MD

## 2010-04-24 NOTE — Letter (Signed)
Summary: Generic Letter  [facility]  [address] [place], [place] [address]   Phone: [phone]  Fax: [phone]    12/15/2009  ZH:[id number] Gries [address] [place], [place]  [address]  Ms. Maddocks can go back to work on 12/18/09           Sincerely,   Jacinta Shoe MD

## 2010-04-24 NOTE — Progress Notes (Signed)
Summary: OOW extended  Phone Note Call from Patient   Summary of Call: She spoke to her HR and needs out of work time extended to end of Sept returning to work on 12-25-09.  Please advise if ok, we will need to update a new OOW note. Initial call taken by: Lanier Prude, [facility]),  November 30, 2009 1:09 PM  Follow-up for Phone Call         OK RTC end of Sept Follow-up by: Tresa Garter MD,  November 30, 2009 10:46 PM  Additional Follow-up for Phone Call Additional follow up Details #1::        I called pt and advised of above.  She now states her HR rep informed her she could return on 12-11-09 so she does not need any further documentation. She will call us when she needs [place]. Additional Follow-up by: Lanier Prude, [facility]),  December 01, 2009 1:55 PM    Additional Follow-up for Phone Call Additional follow up Details #2::    ok Thx Follow-up by: Tresa Garter MD,  December 01, 2009 5:31 PM

## 2010-04-24 NOTE — Assessment & Plan Note (Signed)
Summary: FU/ BACK TO WORK NOTE/NWS#   Vital Signs:  Patient profile:   50 year old female Height:      63 inches Weight:      168 pounds BMI:     29.87 O2 Sat:      96 % on Room air Temp:     98.1 degrees F oral Pulse rate:   87 / minute Pulse rhythm:   regular Resp:     16 per minute BP sitting:   130 / 100  (left arm) Cuff size:   regular  Vitals Entered By: Lanier Prude, CMA(AAMA) (November 30, 2009 8:15 AM)  O2 Flow:  Room air CC: f/u Is Patient Diabetic? No   Primary Care Provider:  Tresa Garter MD  CC:  f/u.  History of Present Illness: F/u stress, anxiety, insomnia, depression and elev. BP. She has an appt w/HR today  Current Medications (verified): 1)  Loratadine 10 Mg  Tabs (Loratadine) .... Once Daily As Needed Allergies 2)  Triamcinolone Acetonide 0.5 % Crea (Triamcinolone Acetonide) .... Apply Bid To Affected Area 3)  Vitamin D3 1000 Unit  Tabs (Cholecalciferol) .Marland Kitchen.. 1 By Mouth Daily 4)  Zolpidem Tartrate 10 Mg Tabs (Zolpidem Tartrate) .... 1/2-1 Tab At Bedtime As Needed Insomnia 5)  Citalopram Hydrobromide 20 Mg Tabs (Citalopram Hydrobromide) .Marland Kitchen.. 1 By Mouth Once Daily For Depression 6)  Hydrochlorothiazide 12.5 Mg Caps (Hydrochlorothiazide) .Marland Kitchen.. 1 By Mouth Once Daily For Blood Pressure  Allergies (verified): 1)  ! Sulfa 2)  ! Penicillin  Past History:  Past Medical History: Last updated: 10/16/2009 ACCIDENTAL FALL ON OR FROM OTHER STAIRS OR STEPS (ICD-E880.9) LOW BACK PAIN (ICD-724.2) LEG PAIN (ICD-729.5) CONTUSION, HEAD (ICD-920) PRURITUS (ICD-698.9) ALLERGIC RHINITIS (ICD-477.9) RASH AND OTHER NONSPECIFIC SKIN ERUPTION (ICD-782.1) VITAMIN D DEFICIENCY (ICD-268.9) ARM PAIN (ICD-729.5) CHEST PAIN, UNSPECIFIED (ICD-786.50) GOITER, MULTINODULAR (ICD-241.1) NECK PAIN (ICD-723.1) Stress disorder 2011  Social History: Last updated: 04/03/2007 Occupation: [employer] Married Nonsmoker  Review of Systems  The patient denies fever,  dyspnea on exertion, and abdominal pain.         BP at home 132/82  130/80  Physical Exam  General:  In moderate distress due to being upset - crying.    Nose:  External nasal examination shows no deformity or inflammation. Nasal mucosa are pink and moist without lesions or exudates. Mouth:  Oral mucosa and oropharynx without lesions or exudates.  Teeth in good repair. Neck:  No deformities, masses, or tenderness noted. Lungs:  clear to auscultation.  no respiratory distress  Heart:  regular rate and rhythm, S1, S2 without murmurs, rubs, gallops, or clicks Abdomen:  Bowel sounds positive,abdomen soft and non-tender without masses, organomegaly or hernias noted. Msk:  No deformity or scoliosis noted of thoracic or lumbar spine.   Neurologic:  No cranial nerve deficits noted. Station and gait are normal. Plantar reflexes are down-going bilaterally. DTRs are symmetrical throughout. Sensory, motor and coordinative functions appear intact. Skin:  Intact without suspicious lesions or rashes Psych:  Oriented X3, agitated a little, not suicidal, not homicidal, depressed affect, tearful, and moderately anxious.  She is upset.     Impression & Recommendations:  Problem # 1:  DEPRESSION/ANXIETY (ICD-300.4) Assessment Improved On the regimen of medicine(s) reflected in the chart    Problem # 2:  STRESS DISORDER (ICD-V62.89) Assessment: Improved Discussed  Problem # 3:  ELEVATED BLOOD PRESSURE (ICD-796.2) Assessment: Improved  Her updated medication list for this problem includes:    Hydrochlorothiazide 12.5  Mg Caps (Hydrochlorothiazide) .Marland Kitchen... 1 by mouth once daily for blood pressure  BP today: 130/100 Prior BP: 170/100 (10/31/2009)  Labs Reviewed: Creat: 0.4 (05/16/2009) Chol: 150 (05/16/2009)   HDL: 44.30 (05/16/2009)   LDL: 86 (05/16/2009)   TG: 101.0 (05/16/2009)  Instructed in low sodium diet (DASH Handout) and behavior modification.    Problem # 4:  VITAMIN D DEFICIENCY  (ICD-268.9) Assessment: Unchanged On the regimen of medicine(s) reflected in the chart    Complete Medication List: 1)  Loratadine 10 Mg Tabs (Loratadine) .... Once daily as needed allergies 2)  Triamcinolone Acetonide 0.5 % Crea (Triamcinolone acetonide) .... Apply bid to affected area 3)  Vitamin D3 1000 Unit Tabs (Cholecalciferol) .Marland Kitchen.. 1 by mouth daily 4)  Zolpidem Tartrate 10 Mg Tabs (Zolpidem tartrate) .... 1/2-1 tab at bedtime as needed insomnia 5)  Citalopram Hydrobromide 20 Mg Tabs (Citalopram hydrobromide) .Marland Kitchen.. 1 by mouth once daily for depression 6)  Hydrochlorothiazide 12.5 Mg Caps (Hydrochlorothiazide) .Marland Kitchen.. 1 by mouth once daily for blood pressure  Other Orders: Admin 1st Vaccine ([address]) Flu Vaccine 14yrs + ([address])  Patient Instructions: 1)  Please schedule a follow-up appointment in 1 week.  .lbflu   Flu Vaccine Consent Questions     Do you have a history of severe allergic reactions to this vaccine? no    Any prior history of allergic reactions to egg and/or gelatin? no    Do you have a sensitivity to the preservative Thimersol? no    Do you have a past history of Guillan-Barre Syndrome? no    Do you currently have an acute febrile illness? no    Have you ever had a severe reaction to latex? no    Vaccine information given and explained to patient? yes    Are you currently pregnant? no    Lot Number:[id number]   Exp Date:09/22/2010   Site Given  Left Deltoid IM Lanier Prude, [facility])  November 30, 2009 8:58 AM

## 2010-04-24 NOTE — Assessment & Plan Note (Signed)
Summary: 4 MTH FU  STC   Vital Signs:  Patient profile:   50 year old female Height:      63 inches (160.02 cm) Weight:      159.50 pounds (72.50 kg) O2 Sat:      93 % on Room air Temp:     98.2 degrees F (36.78 degrees C) oral Pulse rate:   116 / minute BP sitting:   148 / 88  (left arm) Cuff size:   regular  Vitals Entered By: Josph Macho CMA (May 05, 2009 11:22 AM)  O2 Flow:  Room air CC: 4 month follow up/ CF   Referring Provider:  DR Posey Rea Primary Provider:  Georgina Quint Plotnikov MD  CC:  4 month follow up/ CF.  History of Present Illness: pt says she has lost weight.  pt states she otherwise feels well in general.   Current Medications (verified): 1)  Loratadine 10 Mg  Tabs (Loratadine) .... Once Daily As Needed Allergies 2)  Triamcinolone Acetonide 0.5 % Crea (Triamcinolone Acetonide) .... Apply Bid To Affected Area 3)  Vitamin D3 1000 Unit  Tabs (Cholecalciferol) .Marland Kitchen.. 1 By Mouth Daily  Allergies (verified): 1)  ! Sulfa 2)  ! Penicillin  Past History:  Past Medical History: Last updated: 03/07/2008 ACCIDENTAL FALL ON OR FROM OTHER STAIRS OR STEPS (ICD-E880.9) LOW BACK PAIN (ICD-724.2) LEG PAIN (ICD-729.5) CONTUSION, HEAD (ICD-920) PRURITUS (ICD-698.9) ALLERGIC RHINITIS (ICD-477.9) RASH AND OTHER NONSPECIFIC SKIN ERUPTION (ICD-782.1) VITAMIN D DEFICIENCY (ICD-268.9) ARM PAIN (ICD-729.5) CHEST PAIN, UNSPECIFIED (ICD-786.50) GOITER, MULTINODULAR (ICD-241.1) NECK PAIN (ICD-723.1)  Review of Systems       no neck pain  Physical Exam  General:  normal appearance.   Neck:  i can feel a nodule at the left lower thyroid lobe   Impression & Recommendations:  Problem # 1:  HYPERTHYROIDISM (ICD-242.90) due to #1  Problem # 2:  GOITER, MULTINODULAR (ICD-241.1) Assessment: Unchanged  Other Orders: TLB-TSH (Thyroid Stimulating Hormone) (84443-TSH) Est. Patient Level III ([address])  Patient Instructions: 1)  tests are being ordered for you  today.  a few days after the test(s), please call [phone] to hear your test results. 2)  return 6 months

## 2010-04-24 NOTE — Progress Notes (Signed)
  Phone Note Outgoing Call   Call placed by: Torrin Frein, md Summary of Call: tsh was low.  i have ordered scan, to see if another i-131 rx is feasable.  if so, return 6 weeks after the treatment.   pt agrees.

## 2010-04-24 NOTE — Assessment & Plan Note (Signed)
Summary: per pt  fu  stc   Vital Signs:  Patient profile:   50 year old female Height:      63 inches (160.02 cm) Weight:      173.25 pounds (78.75 kg) BMI:     30.80 O2 Sat:      95 % on Room air Temp:     98.8 degrees F (37.11 degrees C) oral Pulse rate:   81 / minute BP sitting:   112 / 80  (left arm) Cuff size:   regular  Vitals Entered By: Brenton Grills CMA [facility]) (February 08, 2010 8:19 AM)  O2 Flow:  Room air CC: Follow-up visit/aj Is Patient Diabetic? No   Referring Provider:  DR Posey Rea Primary Provider:  Tresa Garter MD  CC:  Follow-up visit/aj.  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  pt states she feels well in general, except for fatigue.   Current Medications (verified): 1)  Loratadine 10 Mg  Tabs (Loratadine) .... Once Daily As Needed Allergies 2)  Triamcinolone Acetonide 0.5 % Crea (Triamcinolone Acetonide) .... Apply Bid To Affected Area 3)  Vitamin D3 1000 Unit  Tabs (Cholecalciferol) .Marland Kitchen.. 1 By Mouth Daily 4)  Zolpidem Tartrate 10 Mg Tabs (Zolpidem Tartrate) .... 1/2-1 Tab At Bedtime As Needed Insomnia 5)  Citalopram Hydrobromide 20 Mg Tabs (Citalopram Hydrobromide) .Marland Kitchen.. 1 By Mouth Once Daily For Depression 6)  Hydrochlorothiazide 12.5 Mg Caps (Hydrochlorothiazide) .Marland Kitchen.. 1 By Mouth Once Daily For Blood Pressure  Allergies (verified): 1)  ! Sulfa 2)  ! Penicillin  Past History:  Past Medical History: Last updated: 10/16/2009 ACCIDENTAL FALL ON OR FROM OTHER STAIRS OR STEPS (ICD-E880.9) LOW BACK PAIN (ICD-724.2) LEG PAIN (ICD-729.5) CONTUSION, HEAD (ICD-920) PRURITUS (ICD-698.9) ALLERGIC RHINITIS (ICD-477.9) RASH AND OTHER NONSPECIFIC SKIN ERUPTION (ICD-782.1) VITAMIN D DEFICIENCY (ICD-268.9) ARM PAIN (ICD-729.5) CHEST PAIN, UNSPECIFIED (ICD-786.50) GOITER, MULTINODULAR (ICD-241.1) NECK PAIN (ICD-723.1) Stress disorder  2011  Review of Systems       The patient complains of weight gain.    Physical Exam  General:  normal appearance.   Eyes:  there is bilat proptosis Neck:  small multinodular goiter, right > left.   Impression & Recommendations:  Problem # 1:  GOITER, MULTINODULAR (ICD-241.1) Assessment Unchanged  Problem # 2:  HYPERTHYROIDISM (ICD-242.90) was improved as of last ov  Other Orders: TLB-TSH (Thyroid Stimulating Hormone) (84443-TSH) TLB-T4 (Thyrox), Free [phone]) Est. Patient Level III ([address])  Patient Instructions: 1)  tests are being ordered for you today.  a few days after the test(s), please call [phone] to hear your test results. 2)  pending the test results, please plan to return in 6 months.   Orders Added: 1)  TLB-TSH (Thyroid Stimulating Hormone) [84443-TSH] 2)  TLB-T4 (Thyrox), Free [02542-HC6C] 3)  Est. Patient Level III [37628]

## 2010-04-24 NOTE — Assessment & Plan Note (Signed)
Summary: follow up-lb   Vital Signs:  Patient profile:   49 year old female Height:      63 inches Weight:      170 pounds BMI:     30.22 Temp:     98.1 degrees F oral Pulse rate:   80 / minute Pulse rhythm:   regular Resp:     16 per minute BP sitting:   138 / 98  (left arm) Cuff size:   regular  Vitals Entered By: Lanier Prude, Beverly Gust) (December 15, 2009 1:52 PM) CC: f/u Comments pt needs Rf on Zolpidem   Primary Care Provider:  Tresa Garter MD  CC:  f/u.  History of Present Illness: F/u HTN,stress and depression. Clydie Braun from [facility] saays she needs a note to be released on Mon. She will stay home while Barkley Bruns, her supervisor, is taking a class. BP is OK at home...  Current Medications (verified): 1)  Loratadine 10 Mg  Tabs (Loratadine) .... Once Daily As Needed Allergies 2)  Triamcinolone Acetonide 0.5 % Crea (Triamcinolone Acetonide) .... Apply Bid To Affected Area 3)  Vitamin D3 1000 Unit  Tabs (Cholecalciferol) .Marland Kitchen.. 1 By Mouth Daily 4)  Zolpidem Tartrate 10 Mg Tabs (Zolpidem Tartrate) .... 1/2-1 Tab At Bedtime As Needed Insomnia 5)  Citalopram Hydrobromide 20 Mg Tabs (Citalopram Hydrobromide) .Marland Kitchen.. 1 By Mouth Once Daily For Depression 6)  Hydrochlorothiazide 12.5 Mg Caps (Hydrochlorothiazide) .Marland Kitchen.. 1 By Mouth Once Daily For Blood Pressure  Allergies (verified): 1)  ! Sulfa 2)  ! Penicillin  Past History:  Past Medical History: Last updated: 10/16/2009 ACCIDENTAL FALL ON OR FROM OTHER STAIRS OR STEPS (ICD-E880.9) LOW BACK PAIN (ICD-724.2) LEG PAIN (ICD-729.5) CONTUSION, HEAD (ICD-920) PRURITUS (ICD-698.9) ALLERGIC RHINITIS (ICD-477.9) RASH AND OTHER NONSPECIFIC SKIN ERUPTION (ICD-782.1) VITAMIN D DEFICIENCY (ICD-268.9) ARM PAIN (ICD-729.5) CHEST PAIN, UNSPECIFIED (ICD-786.50) GOITER, MULTINODULAR (ICD-241.1) NECK PAIN (ICD-723.1) Stress disorder 2011  Social History: Last updated: 04/03/2007 Occupation: [employer] Married Nonsmoker  Review  of Systems       The patient complains of depression.  The patient denies fever, chest pain, and dyspnea on exertion.    Physical Exam  General:  In moderate distress due to being upset - crying.    Nose:  External nasal examination shows no deformity or inflammation. Nasal mucosa are pink and moist without lesions or exudates. Mouth:  Oral mucosa and oropharynx without lesions or exudates.  Teeth in good repair. Neck:  No deformities, masses, or tenderness noted. Lungs:  clear to auscultation.  no respiratory distress  Heart:  regular rate and rhythm, S1, S2 without murmurs, rubs, gallops, or clicks Abdomen:  Bowel sounds positive,abdomen soft and non-tender without masses, organomegaly or hernias noted. Msk:  No deformity or scoliosis noted of thoracic or lumbar spine.   Neurologic:  No cranial nerve deficits noted. Station and gait are normal. Plantar reflexes are down-going bilaterally. DTRs are symmetrical throughout. Sensory, motor and coordinative functions appear intact. Skin:  Intact without suspicious lesions or rashes Psych:  Oriented X3, agitated a little, not suicidal, not homicidal, depressed affect, tearful, and moderately anxious.  She is upset.     Impression & Recommendations:  Problem # 1:  ELEVATED BLOOD PRESSURE (ICD-796.2) Assessment Unchanged Pt states BP is better at home Her updated medication list for this problem includes:    Hydrochlorothiazide 12.5 Mg Caps (Hydrochlorothiazide) .Marland Kitchen... 1 by mouth once daily for blood pressure  Orders: TLB-BMP (Basic Metabolic Panel-BMET) (80048-METABOL) TLB-CBC Platelet - w/Differential (85025-CBCD) TLB-Hepatic/Liver Function  Pnl (80076-HEPATIC) TLB-TSH (Thyroid Stimulating Hormone) (84443-TSH) T-Vitamin D (25-Hydroxy) (10272-53664) TLB-Udip ONLY (81003-UDIP)  BP today: 138/98 Prior BP: 130/100 (11/30/2009)  Labs Reviewed: Creat: 0.4 (05/16/2009) Chol: 150 (05/16/2009)   HDL: 44.30 (05/16/2009)   LDL: 86  (05/16/2009)   TG: 101.0 (05/16/2009)  Instructed in low sodium diet (DASH Handout) and behavior modification.    Problem # 2:  DEPRESSION/ANXIETY (ICD-300.4) Assessment: Improved  Orders: TLB-BMP (Basic Metabolic Panel-BMET) (80048-METABOL) TLB-CBC Platelet - w/Differential (85025-CBCD) TLB-Hepatic/Liver Function Pnl (80076-HEPATIC) TLB-TSH (Thyroid Stimulating Hormone) (84443-TSH) T-Vitamin D (25-Hydroxy) (40347-42595) TLB-Udip ONLY (81003-UDIP)  Problem # 3:  STRESS DISORDER (ICD-V62.89) Assessment: Improved On the regimen of medicine(s) reflected in the chart    Problem # 4:  HYPERTHYROIDISM (ICD-242.90) Assessment: Comment Only  Orders: TLB-BMP (Basic Metabolic Panel-BMET) (80048-METABOL) TLB-CBC Platelet - w/Differential (85025-CBCD) TLB-Hepatic/Liver Function Pnl (80076-HEPATIC) TLB-TSH (Thyroid Stimulating Hormone) (84443-TSH) T-Vitamin D (25-Hydroxy) (63875-64332) TLB-Udip ONLY (81003-UDIP)  Problem # 5:  INSOMNIA, CHRONIC (ICD-307.42) Assessment: Unchanged On the regimen of medicine(s) reflected in the chart    Complete Medication List: 1)  Loratadine 10 Mg Tabs (Loratadine) .... Once daily as needed allergies 2)  Triamcinolone Acetonide 0.5 % Crea (Triamcinolone acetonide) .... Apply bid to affected area 3)  Vitamin D3 1000 Unit Tabs (Cholecalciferol) .Marland Kitchen.. 1 by mouth daily 4)  Zolpidem Tartrate 10 Mg Tabs (Zolpidem tartrate) .... 1/2-1 tab at bedtime as needed insomnia 5)  Citalopram Hydrobromide 20 Mg Tabs (Citalopram hydrobromide) .Marland Kitchen.. 1 by mouth once daily for depression 6)  Hydrochlorothiazide 12.5 Mg Caps (Hydrochlorothiazide) .Marland Kitchen.. 1 by mouth once daily for blood pressure  Patient Instructions: 1)  Please schedule a follow-up appointment in 1 month. Prescriptions: ZOLPIDEM TARTRATE 10 MG TABS (ZOLPIDEM TARTRATE) 1/2-1 tab at bedtime as needed insomnia  #30 x 6   Entered and Authorized by:   Tresa Garter MD   Signed by:   Tresa Garter MD on  12/15/2009   Method used:   Print then Give to Patient   RxID:   [id number]

## 2010-04-24 NOTE — Letter (Signed)
Summary: Out of Work  [facility]  [address] [place], [place] [address]   Phone: [phone]  Fax: [phone]    November 30, 2009   Employee:  [name]    To Whom It May Concern:   For Medical reasons, please excuse the above named employee from work for the following dates:  Start:   12/01/2009  End:   12/08/2009  If you need additional information, please feel free to contact our office.         Sincerely,    Ami Bullins CMA  Appended Document: Out of Work above dates were incorrect per pt. I corrected dates to reflect out of work from 10-31-09 until 12-08-09. Letter signed and gave to pt.Marland KitchenMarland KitchenMarland KitchenLanier Prude, [facility]   11/30/2009

## 2010-04-24 NOTE — Assessment & Plan Note (Signed)
Summary: STRESS---STC   Vital Signs:  Patient profile:   50 year old female Height:      63 inches Weight:      162 pounds BMI:     28.80 O2 Sat:      96 % on Room air Temp:     98.6 degrees F oral Pulse rate:   84 / minute Pulse rhythm:   regular Resp:     16 per minute BP sitting:   150 / 80  (right arm) Cuff size:   regular  Vitals Entered By: Lanier Prude, CMA(AAMA) (October 16, 2009 11:14 AM)  O2 Flow:  Room air CC: increased stress  Is Patient Diabetic? No   Primary Care Provider:  Tresa Garter MD  CC:  increased stress .  History of Present Illness: C/o stress at work - crying a lot; a lot of HAs - has to take 4-6 Exedrin a day for tension HAs C/o not sleeping - 2 hrs or less; C/o depression due to stress  Current Medications (verified): 1)  Loratadine 10 Mg  Tabs (Loratadine) .... Once Daily As Needed Allergies 2)  Triamcinolone Acetonide 0.5 % Crea (Triamcinolone Acetonide) .... Apply Bid To Affected Area 3)  Vitamin D3 1000 Unit  Tabs (Cholecalciferol) .Marland Kitchen.. 1 By Mouth Daily  Allergies (verified): 1)  ! Sulfa 2)  ! Penicillin  Past History:  Social History: Last updated: 04/03/2007 Occupation: [employer] Married Nonsmoker  Past Medical History: ACCIDENTAL FALL ON OR FROM OTHER STAIRS OR STEPS (ICD-E880.9) LOW BACK PAIN (ICD-724.2) LEG PAIN (ICD-729.5) CONTUSION, HEAD (ICD-920) PRURITUS (ICD-698.9) ALLERGIC RHINITIS (ICD-477.9) RASH AND OTHER NONSPECIFIC SKIN ERUPTION (ICD-782.1) VITAMIN D DEFICIENCY (ICD-268.9) ARM PAIN (ICD-729.5) CHEST PAIN, UNSPECIFIED (ICD-786.50) GOITER, MULTINODULAR (ICD-241.1) NECK PAIN (ICD-723.1) Stress disorder 2011  Review of Systems       The patient complains of weight gain and depression.  The patient denies anorexia and abdominal pain.    Physical Exam  General:  obese.   Nose:  External nasal examination shows no deformity or inflammation. Nasal mucosa are pink and moist without lesions or  exudates. Mouth:  Oral mucosa and oropharynx without lesions or exudates.  Teeth in good repair. Neck:  slight fullness at left lower pole of thyroid  Lungs:  clear to auscultation.  no respiratory distress  Heart:  regular rate and rhythm, S1, S2 without murmurs, rubs, gallops, or clicks Abdomen:  Bowel sounds positive,abdomen soft and non-tender without masses, organomegaly or hernias noted. Msk:  Cervical spine is tender to palpation over paraspinal muscles and with the ROM  Extremities:  No clubbing, cyanosis, edema, or deformity noted with normal full range of motion of all joints.   Neurologic:  No cranial nerve deficits noted. Station and gait are normal. Plantar reflexes are down-going bilaterally. DTRs are symmetrical throughout. Sensory, motor and coordinative functions appear intact. Skin:  Intact without suspicious lesions or rashes Psych:  Oriented X3, not agitated, not suicidal, not homicidal, depressed affect, tearful, and slightly anxious.     Impression & Recommendations:  Problem # 1:  STRESS DISORDER (ICD-V62.89) Take 2 wks off work if possible  Problem # 2:  DEPRESSION/ANXIETY (ICD-300.4) situational Assessment: Deteriorated Councelling offered Start Citalopram  Problem # 3:  HEADACHE (ICD-784.0) Assessment: Deteriorated  Problem # 4:  VITAMIN D DEFICIENCY (ICD-268.9) Assessment: Comment Only  Orders: TLB-B12, Serum-Total ONLY (29562-Z30) TLB-BMP (Basic Metabolic Panel-BMET) (80048-METABOL) TLB-CBC Platelet - w/Differential (85025-CBCD) TLB-Hepatic/Liver Function Pnl (80076-HEPATIC) TLB-TSH (Thyroid Stimulating Hormone) (84443-TSH) TLB-Udip ONLY (81003-UDIP)  T-Vitamin D (25-Hydroxy) [phone])  Problem # 5:  ELEVATED BLOOD PRESSURE (ICD-796.2) due to stress Assessment: New  Orders: TLB-B12, Serum-Total ONLY (09811-B14) TLB-BMP (Basic Metabolic Panel-BMET) (80048-METABOL) TLB-CBC Platelet - w/Differential (85025-CBCD) TLB-Hepatic/Liver Function Pnl  (80076-HEPATIC) TLB-TSH (Thyroid Stimulating Hormone) (84443-TSH) TLB-Udip ONLY (81003-UDIP) T-Vitamin D (25-Hydroxy) (78295-62130)  Problem # 6:  INSOMNIA, CHRONIC (ICD-307.42) due to stress Assessment: New Zolpidem as needed Risks vs benefits and controversies of a long term sleep meds use were discussed.   Complete Medication List: 1)  Loratadine 10 Mg Tabs (Loratadine) .... Once daily as needed allergies 2)  Triamcinolone Acetonide 0.5 % Crea (Triamcinolone acetonide) .... Apply bid to affected area 3)  Vitamin D3 1000 Unit Tabs (Cholecalciferol) .Marland Kitchen.. 1 by mouth daily 4)  Citalopram Hydrobromide 10 Mg Tabs (Citalopram hydrobromide) .Marland Kitchen.. 1 by mouth once daily for depression 5)  Zolpidem Tartrate 10 Mg Tabs (Zolpidem tartrate) .... 1/2-1 tab at bedtime as needed insomnia  Patient Instructions: 1)  Please schedule a follow-up appointment in 2 weeks. 2)  Taper off Exedrin slowly Prescriptions: ZOLPIDEM TARTRATE 10 MG TABS (ZOLPIDEM TARTRATE) 1/2-1 tab at bedtime as needed insomnia  #30 x 6   Entered and Authorized by:   Tresa Garter MD   Signed by:   Tresa Garter MD on 10/16/2009   Method used:   Print then Give to Patient   RxID:   [id number] CITALOPRAM HYDROBROMIDE 10 MG TABS (CITALOPRAM HYDROBROMIDE) 1 by mouth once daily for depression  #30 x 6   Entered and Authorized by:   Tresa Garter MD   Signed by:   Tresa Garter MD on 10/16/2009   Method used:   Electronically to        [facility]  [address] [phone]* (retail)       [address]       [place], [place]  [address]       Ph: [phone]       Fax: [phone]   RxID:   [id number]

## 2010-04-24 NOTE — Letter (Signed)
Summary: Out of Work  [facility]  [address] [place], [place] [address]   Phone: [phone]  Fax: [phone]    October 31, 2009   Employee:  [name]    To Whom It May Concern:   For Medical reasons, please excuse the above named employee from work for the following dates:  Start:   10/31/09  End:   12/01/09  If you need additional information, please feel free to contact our office.         Sincerely,    Tresa Garter MD

## 2010-04-24 NOTE — Letter (Signed)
Summary: Generic Letter  [facility]  [address] [place], [place] [address]   Phone: [phone]  Fax: [phone]    12/15/2009  RE:  Krystal Gibbs [address] [place], [place]  [address]  Ms. Slaght's hypertension was triggered by stressful work environment.           Sincerely,   Jacinta Shoe MD

## 2010-04-24 NOTE — Progress Notes (Signed)
Summary: OOW note extension  Phone Note Call from Patient Call back at Home Phone [phone]   Caller: Patient Summary of Call: Pt requests work note to be extended till 9/26 instead of 9/19. Pt taken out of work (start date 7/25 to end date 9/26).  Initial call taken by: Verdell Face,  December 07, 2009 8:09 AM  Follow-up for Phone Call        ok Follow-up by: Tresa Garter MD,  December 07, 2009 12:48 PM  Additional Follow-up for Phone Call Additional follow up Details #1::        letter completed.  will put letter upfront for pt to p/u. Additional Follow-up by: Lanier Prude, [facility]),  December 07, 2009 2:47 PM

## 2010-04-24 NOTE — Assessment & Plan Note (Signed)
Summary: FU PER PT  D/T-- STC   Vital Signs:  Patient profile:   50 year old female Height:      63 inches (160.02 cm) Weight:      154.38 pounds (70.17 kg) BMI:     27.45 O2 Sat:      97 % on Room air Temp:     98.4 degrees F (36.89 degrees C) oral Pulse rate:   87 / minute BP sitting:   138 / 82  (left arm) Cuff size:   regular  Vitals Entered By: Josph Macho RMA (Aug 17, 2009 11:24 AM)  O2 Flow:  Room air CC: Follow-up visit/ CF Is Patient Diabetic? No   Referring Provider:  DR Posey Rea Primary Provider:  Georgina Quint Plotnikov MD  CC:  Follow-up visit/ CF.  History of Present Illness: pt is now 6 weeks s/p i-131 rx for hyperthyroidism, due to a multinodular goiter.  pt states she feels well in general.  Current Medications (verified): 1)  Loratadine 10 Mg  Tabs (Loratadine) .... Once Daily As Needed Allergies 2)  Triamcinolone Acetonide 0.5 % Crea (Triamcinolone Acetonide) .... Apply Bid To Affected Area 3)  Vitamin D3 1000 Unit  Tabs (Cholecalciferol) .Marland Kitchen.. 1 By Mouth Daily  Allergies (verified): 1)  ! Sulfa 2)  ! Penicillin  Past History:  Past Medical History: Last updated: 03/07/2008 ACCIDENTAL FALL ON OR FROM OTHER STAIRS OR STEPS (ICD-E880.9) LOW BACK PAIN (ICD-724.2) LEG PAIN (ICD-729.5) CONTUSION, HEAD (ICD-920) PRURITUS (ICD-698.9) ALLERGIC RHINITIS (ICD-477.9) RASH AND OTHER NONSPECIFIC SKIN ERUPTION (ICD-782.1) VITAMIN D DEFICIENCY (ICD-268.9) ARM PAIN (ICD-729.5) CHEST PAIN, UNSPECIFIED (ICD-786.50) GOITER, MULTINODULAR (ICD-241.1) NECK PAIN (ICD-723.1)  Review of Systems  The patient denies weight loss and weight gain.    Physical Exam  General:  normal appearance.   Eyes:  there is slight bilateral proptosis Neck:  small multinodular goiter, right > left   Impression & Recommendations:  Problem # 1:  HYPERTHYROIDISM (ICD-242.90) s/p i-131 rx  Other Orders: TLB-TSH (Thyroid Stimulating Hormone) (84443-TSH) TLB-Hepatic/Liver  Function Pnl (80076-HEPATIC) Est. Patient Level III ([address])  Patient Instructions: 1)  tests are being ordered for you today.  a few days after the test(s), please call [phone] to hear your test results. 2)  pending the test results, please plan to return in 6 weeks.

## 2010-04-24 NOTE — Progress Notes (Signed)
Summary: Req a call   Phone Note Call from Patient Call back at Home Phone [phone] Call back at Work Phone [phone]   Summary of Call: Patient is requesting a call back from Dr Posey Rea regarding tests ordered.  Initial call taken by: Lamar Sprinkles, CMA,  May 19, 2009 1:45 PM  Follow-up for Phone Call        called both #'s - no answer, left her a VM called on 2/28 - no answer Follow-up by: Tresa Garter MD,  May 19, 2009 5:39 PM  Additional Follow-up for Phone Call Additional follow up Details #1::        Pt had returned dr's call on 2/25, she only wants to talk to you Dr Posey Rea, says she already spoke with Dr Everardo All via phone. You can reach her at wk # 8 to 12 and 1 to 5.  Additional Follow-up by: Lamar Sprinkles, CMA,  May 25, 2009 7:56 AM    Additional Follow-up for Phone Call Additional follow up Details #2::    Erie Noe has talked to [facility] - [place] Follow-up by: Tresa Garter MD,  May 29, 2009 11:50 PM

## 2010-04-24 NOTE — Progress Notes (Signed)
Summary: REQ FOR RX  Phone Note Call from Patient Call back at Home Phone [phone]   Summary of Call: Patient is requesting rx for hctz to go to [facility] pharmacy.  Initial call taken by: Lamar Sprinkles, CMA,  October 31, 2009 5:15 PM  Follow-up for Phone Call        ok Follow-up by: Tresa Garter MD,  October 31, 2009 5:59 PM  Additional Follow-up for Phone Call Additional follow up Details #1::        Pt informed  Additional Follow-up by: Lamar Sprinkles, CMA,  November 01, 2009 1:38 PM    New/Updated Medications: HYDROCHLOROTHIAZIDE 12.5 MG CAPS (HYDROCHLOROTHIAZIDE) 1 by mouth once daily for blood pressure Prescriptions: HYDROCHLOROTHIAZIDE 12.5 MG CAPS (HYDROCHLOROTHIAZIDE) 1 by mouth once daily for blood pressure  #30 x 12   Entered and Authorized by:   Tresa Garter MD   Signed by:   Tresa Garter MD on 11/01/2009   Method used:   Electronically to        [facility]* (retail)       [address]       [address] Shipping/mailing       [place], [place]  [address]       Ph: [phone]       Fax: [phone]   RxID:   [id number]

## 2010-04-24 NOTE — Assessment & Plan Note (Signed)
Summary: 2 wk f/u .Marland Kitchen#/cd   Vital Signs:  Patient profile:   50 year old female Height:      63 inches Weight:      163 pounds BMI:     28.98 Temp:     98.1 degrees F oral Pulse rate:   88 / minute Pulse rhythm:   regular Resp:     16 per minute BP sitting:   170 / 100  (left arm) Cuff size:   regular  Vitals Entered By: Lanier Prude, CMA(AAMA) (October 31, 2009 11:30 AM) CC: f/u Is Patient Diabetic? No   Primary Care Provider:  Tresa Garter MD  CC:  f/u.  History of Present Illness: F/u stress and depression with anxiety. She was doing well at home. All stress and nervous breakdown came back today at work. She was doing well before she got back to work. Her BP was nl and she was sleeping well....  Current Medications (verified): 1)  Loratadine 10 Mg  Tabs (Loratadine) .... Once Daily As Needed Allergies 2)  Triamcinolone Acetonide 0.5 % Crea (Triamcinolone Acetonide) .... Apply Bid To Affected Area 3)  Vitamin D3 1000 Unit  Tabs (Cholecalciferol) .Marland Kitchen.. 1 By Mouth Daily 4)  Citalopram Hydrobromide 10 Mg Tabs (Citalopram Hydrobromide) .Marland Kitchen.. 1 By Mouth Once Daily For Depression 5)  Zolpidem Tartrate 10 Mg Tabs (Zolpidem Tartrate) .... 1/2-1 Tab At Bedtime As Needed Insomnia  Allergies (verified): 1)  ! Sulfa 2)  ! Penicillin  Past History:  Past Medical History: Last updated: 10/16/2009 ACCIDENTAL FALL ON OR FROM OTHER STAIRS OR STEPS (ICD-E880.9) LOW BACK PAIN (ICD-724.2) LEG PAIN (ICD-729.5) CONTUSION, HEAD (ICD-920) PRURITUS (ICD-698.9) ALLERGIC RHINITIS (ICD-477.9) RASH AND OTHER NONSPECIFIC SKIN ERUPTION (ICD-782.1) VITAMIN D DEFICIENCY (ICD-268.9) ARM PAIN (ICD-729.5) CHEST PAIN, UNSPECIFIED (ICD-786.50) GOITER, MULTINODULAR (ICD-241.1) NECK PAIN (ICD-723.1) Stress disorder 2011  Past Surgical History: Last updated: 02/10/2007 Hysterectomy  Family History: Last updated: 03/07/2008 No CAD no thyroid dz  Social History: Last updated:  04/03/2007 Occupation: [employer] Married Nonsmoker  Review of Systems       The patient complains of depression.    Physical Exam  General:  In moderate distress due to being upset - crying.    Eyes:  Red Mouth:  Oral mucosa and oropharynx without lesions or exudates.  Teeth in good repair. Neck:  No deformities, masses, or tenderness noted. Heart:  regular rate and rhythm, S1, S2 without murmurs, rubs, gallops, or clicks Abdomen:  Bowel sounds positive,abdomen soft and non-tender without masses, organomegaly or hernias noted. Msk:  No deformity or scoliosis noted of thoracic or lumbar spine.   Neurologic:  No cranial nerve deficits noted. Station and gait are normal. Plantar reflexes are down-going bilaterally. DTRs are symmetrical throughout. Sensory, motor and coordinative functions appear intact. Skin:  Intact without suspicious lesions or rashes Psych:  Oriented X3, agitated a little, not suicidal, not homicidal, depressed affect, tearful, and moderately anxious.  She is upset.     Impression & Recommendations:  Problem # 1:  DEPRESSION/ANXIETY (ICD-300.4) Assessment Deteriorated We will take her out of work x 1 month. She needs to change jobs within [employer] system; going back to work did not work out...  Problem # 2:  INSOMNIA, CHRONIC (ICD-307.42) Assessment: Improved  Problem # 3:  STRESS DISORDER (ICD-V62.89) Assessment: Deteriorated As per #1  Problem # 4:  ELEVATED BLOOD PRESSURE (ICD-796.2) Assessment: New Monitor at home  Complete Medication List: 1)  Loratadine 10 Mg Tabs (Loratadine) .Marland KitchenMarland KitchenMarland Kitchen  Once daily as needed allergies 2)  Triamcinolone Acetonide 0.5 % Crea (Triamcinolone acetonide) .... Apply bid to affected area 3)  Vitamin D3 1000 Unit Tabs (Cholecalciferol) .Marland Kitchen.. 1 by mouth daily 4)  Zolpidem Tartrate 10 Mg Tabs (Zolpidem tartrate) .... 1/2-1 tab at bedtime as needed insomnia 5)  Citalopram Hydrobromide 20 Mg Tabs (Citalopram hydrobromide) .Marland Kitchen..  1 by mouth once daily for depression  Patient Instructions: 1)  Please schedule a follow-up appointment in 1 month. 2)  Call if you are not better in a reasonable amount of time or if worse.  Prescriptions: CITALOPRAM HYDROBROMIDE 20 MG TABS (CITALOPRAM HYDROBROMIDE) 1 by mouth once daily for depression  #30 x 6   Entered and Authorized by:   Tresa Garter MD   Signed by:   Tresa Garter MD on 10/31/2009   Method used:   Print then Give to Patient   RxID:   [id number]

## 2010-04-24 NOTE — Progress Notes (Signed)
Summary: Krystal Gibbs  Phone Note Call from Patient   Caller: Patient Call For: Tresa Garter MD Summary of Call: pt called ---stated she wanted  to know if you would order a full blood panel for her since she saw Dr.  Everardo All today and he ordered blood work for her about her thyroid.    that way she will not have to get stuck twice./vg   Please advise,  Initial call taken by: Tora Perches,  May 05, 2009 1:24 PM  Follow-up for Phone Call        Pls order CBC, TSH, BMET, Hepatic panel, UA, Lipids, Dx: V70.0  Follow-up by: Tresa Garter MD,  May 05, 2009 5:25 PM  Additional Follow-up for Phone Call Additional follow up Details #1::        no answer/no machine. Additional Follow-up by: Lucious Groves,  May 08, 2009 4:34 PM    Additional Follow-up for Phone Call Additional follow up Details #2::    Pt informed, labs in idx Follow-up by: Lamar Sprinkles, CMA,  May 08, 2009 5:19 PM

## 2010-04-26 NOTE — Assessment & Plan Note (Signed)
Summary: f/u appt/#/cd   Vital Signs:  Patient profile:   50 year old female Height:      63 inches Weight:      177 pounds BMI:     31.47 Temp:     98.7 degrees F oral Pulse rate:   80 / minute Pulse rhythm:   regular Resp:     16 per minute BP sitting:   140 / 92  (left arm) Cuff size:   regular  Vitals Entered By: Lanier Prude, Beverly Gust) (March 13, 2010 9:53 AM) CC: f/u  Is Patient Diabetic? No Comments pt has not started levothyroxine yet   Primary Care Provider:  Tresa Garter MD  CC:  f/u .  History of Present Illness: The patient presents for a follow up of hypertension, depression, hypothyroidism, hyperlipidemia   Current Medications (verified): 1)  Loratadine 10 Mg  Tabs (Loratadine) .... Once Daily As Needed Allergies 2)  Triamcinolone Acetonide 0.5 % Crea (Triamcinolone Acetonide) .... Apply Bid To Affected Area 3)  Vitamin D3 1000 Unit  Tabs (Cholecalciferol) .Marland Kitchen.. 1 By Mouth Daily 4)  Zolpidem Tartrate 10 Mg Tabs (Zolpidem Tartrate) .... 1/2-1 Tab At Bedtime As Needed Insomnia 5)  Citalopram Hydrobromide 20 Mg Tabs (Citalopram Hydrobromide) .Marland Kitchen.. 1 By Mouth Once Daily For Depression 6)  Hydrochlorothiazide 12.5 Mg Caps (Hydrochlorothiazide) .Marland Kitchen.. 1 By Mouth Once Daily For Blood Pressure 7)  Vitamin D (Ergocalciferol) 50000 Unit Caps (Ergocalciferol) .Marland Kitchen.. 1 By Mouth Weekly For 6 Weeks 8)  Synthroid 50 Mcg Tabs (Levothyroxine Sodium) .Marland Kitchen.. 1 By Mouth Once Daily  Allergies (verified): 1)  ! Sulfa 2)  ! Penicillin  Past History:  Social History: Last updated: 04/03/2007 Occupation: [employer] Married Nonsmoker  Past Medical History: ACCIDENTAL FALL ON OR FROM OTHER STAIRS OR STEPS (ICD-E880.9) LOW BACK PAIN (ICD-724.2) LEG PAIN (ICD-729.5) CONTUSION, HEAD (ICD-920) PRURITUS (ICD-698.9) ALLERGIC RHINITIS (ICD-477.9) RASH AND OTHER NONSPECIFIC SKIN ERUPTION (ICD-782.1) VITAMIN D DEFICIENCY (ICD-268.9) ARM PAIN (ICD-729.5) CHEST PAIN,  UNSPECIFIED (ICD-786.50) GOITER, MULTINODULAR (ICD-241.1) s/p  XRT Iodine Rx 2011 Dr Everardo All NECK PAIN (ICD-723.1) Stress disorder 2011 Hypothyroidism 2011  Family History: Reviewed history from 03/07/2008 and no changes required. No CAD no thyroid dz  Social History: Reviewed history from 04/03/2007 and no changes required. Occupation: [employer] Married Nonsmoker  Review of Systems       The patient complains of weight gain.  The patient denies chest pain, dyspnea on exertion, abdominal pain, and depression.    Physical Exam  General:  In moderate distress due to being upset - crying.    Head:  Normocephalic and atraumatic without obvious abnormalities. No apparent alopecia or balding. Eyes:  Red Mouth:  Oral mucosa and oropharynx without lesions or exudates.  Teeth in good repair. Lungs:  clear to auscultation.  no respiratory distress  Heart:  regular rate and rhythm, S1, S2 without murmurs, rubs, gallops, or clicks Abdomen:  Bowel sounds positive,abdomen soft and non-tender without masses, organomegaly or hernias noted. Msk:  No deformity or scoliosis noted of thoracic or lumbar spine.   Extremities:  B dist lat shins with swelling L>R, NT Neurologic:  No cranial nerve deficits noted. Station and gait are normal. Plantar reflexes are down-going bilaterally. DTRs are symmetrical throughout. Sensory, motor and coordinative functions appear intact. Skin:  Intact without suspicious lesions or rashes Psych:  Oriented X3, good eye contact, not anxious appearing, and not depressed appearing.     Impression & Recommendations:  Problem # 1:  ELEVATED  BLOOD PRESSURE (ICD-796.2) Assessment Improved BP is OK at home Her updated medication list for this problem includes:    Hydrochlorothiazide 12.5 Mg Caps (Hydrochlorothiazide) .Marland Kitchen... 1 by mouth once daily for blood pressure  Problem # 2:  DEPRESSION/ANXIETY (ICD-300.4) Assessment: Improved On the regimen of medicine(s)  reflected in the chart    Problem # 3:  VITAMIN D DEFICIENCY (ICD-268.9) Assessment: Deteriorated On the regimen of medicine(s) reflected in the chart    Problem # 4:  EDEMA (ICD-782.3) LE B Assessment: New Treat #5 Her updated medication list for this problem includes:    Hydrochlorothiazide 12.5 Mg Caps (Hydrochlorothiazide) .Marland Kitchen... 1 by mouth once daily for blood pressure  Problem # 5:  HYPOTHYROIDISM (ICD-244.9) Assessment: New  Her updated medication list for this problem includes:    Synthroid 50 Mcg Tabs (Levothyroxine sodium) .Marland Kitchen... 1 by mouth once daily  Complete Medication List: 1)  Loratadine 10 Mg Tabs (Loratadine) .... Once daily as needed allergies 2)  Triamcinolone Acetonide 0.5 % Crea (Triamcinolone acetonide) .... Apply bid to affected area 3)  Vitamin D3 1000 Unit Tabs (Cholecalciferol) .Marland Kitchen.. 1 by mouth daily 4)  Zolpidem Tartrate 10 Mg Tabs (Zolpidem tartrate) .... 1/2-1 tab at bedtime as needed insomnia 5)  Citalopram Hydrobromide 20 Mg Tabs (Citalopram hydrobromide) .Marland Kitchen.. 1 by mouth once daily for depression 6)  Hydrochlorothiazide 12.5 Mg Caps (Hydrochlorothiazide) .Marland Kitchen.. 1 by mouth once daily for blood pressure 7)  Vitamin D (ergocalciferol) 50000 Unit Caps (Ergocalciferol) .Marland Kitchen.. 1 by mouth weekly for 6 weeks 8)  Synthroid 50 Mcg Tabs (Levothyroxine sodium) .Marland Kitchen.. 1 by mouth once daily  Patient Instructions: 1)  Please schedule a follow-up appointment in 3 months. 2)  BMP prior to visit, ICD-9: 3)  TSH prior to visit, ICD-9:244.8 4)  Vit D  268.9   Orders Added: 1)  Est. Patient Level IV [78469]

## 2010-04-26 NOTE — Miscellaneous (Signed)
Summary: mammo 2011  Clinical Lists Changes  Observations: Added new observation of MAMMOGRAM: normal (03/22/2010 11:19)      Preventive Care Screening  Mammogram:    Date:  03/22/2010    Results:  normal     repeat in 1 year

## 2010-04-26 NOTE — Progress Notes (Signed)
Summary: FYI-Synthroid  Phone Note Call from Patient   Caller: Patient Summary of Call: I spoke to pt and informed her or recent lab results and to start Vit D 500000 and Synthroid . She called back stating she was going to wait until her appt on 03-13-10 to discuss this further before she starts Synthroid. Initial call taken by: Lanier Prude, [facility]),  March 06, 2010 11:36 AM  Follow-up for Phone Call        ok  Thank you!  Follow-up by: Tresa Garter MD,  March 06, 2010 4:13 PM

## 2010-04-26 NOTE — Assessment & Plan Note (Signed)
Summary: FU---STC   Vital Signs:  Patient profile:   50 year old female Height:      63 inches Weight:      181 pounds BMI:     32.18 Temp:     98.9 degrees F oral Pulse rate:   84 / minute Pulse rhythm:   regular Resp:     16 per minute BP sitting:   140 / 104  (left arm) Cuff size:   regular  Vitals Entered By: Lanier Prude, CMA(AAMA) (April 13, 2010 8:12 AM) CC: f/u c/o irritation on bilateral lower legs Is Patient Diabetic? No   Primary Care Provider:  Georgina Quint Messina Kosinski MD  CC:  f/u c/o irritation on bilateral lower legs.  History of Present Illness: C/o itchy rash on B lower lat shins since Nov 2011. She started HCT in Sept 2011. F/u HTN,  hypothyroidism, Vit D def.  Current Medications (verified): 1)  Loratadine 10 Mg  Tabs (Loratadine) .... Once Daily As Needed Allergies 2)  Triamcinolone Acetonide 0.5 % Crea (Triamcinolone Acetonide) .... Apply Bid To Affected Area 3)  Vitamin D3 1000 Unit  Tabs (Cholecalciferol) .Marland Kitchen.. 1 By Mouth Daily 4)  Zolpidem Tartrate 10 Mg Tabs (Zolpidem Tartrate) .... 1/2-1 Tab At Bedtime As Needed Insomnia 5)  Citalopram Hydrobromide 20 Mg Tabs (Citalopram Hydrobromide) .Marland Kitchen.. 1 By Mouth Once Daily For Depression 6)  Hydrochlorothiazide 12.5 Mg Caps (Hydrochlorothiazide) .Marland Kitchen.. 1 By Mouth Once Daily For Blood Pressure 7)  Vitamin D (Ergocalciferol) 50000 Unit Caps (Ergocalciferol) .Marland Kitchen.. 1 By Mouth Weekly For 6 Weeks 8)  Synthroid 50 Mcg Tabs (Levothyroxine Sodium) .Marland Kitchen.. 1 By Mouth Once Daily  Allergies (verified): 1)  ! Sulfa 2)  ! Penicillin  Past History:  Social History: Last updated: 04/03/2007 Occupation: [employer] Married Nonsmoker  Past Medical History: ACCIDENTAL FALL ON OR FROM OTHER STAIRS OR STEPS (ICD-E880.9) LOW BACK PAIN (ICD-724.2) LEG PAIN (ICD-729.5) CONTUSION, HEAD (ICD-920) PRURITUS (ICD-698.9) ALLERGIC RHINITIS (ICD-477.9) RASH AND OTHER NONSPECIFIC SKIN ERUPTION (ICD-782.1) VITAMIN D DEFICIENCY  (ICD-268.9) ARM PAIN (ICD-729.5) CHEST PAIN, UNSPECIFIED (ICD-786.50) GOITER, MULTINODULAR (ICD-241.1) s/p  XRT Iodine Rx 2011 Dr Everardo All NECK PAIN (ICD-723.1) Stress disorder 2011 Hypothyroidism 2011 Hypertension  Review of Systems  The patient denies anorexia, fever, weight loss, weight gain, abdominal pain, melena, and hematochezia.         feeling well  Physical Exam  General:  In moderate distress due to being upset - crying.    Nose:  External nasal examination shows no deformity or inflammation. Nasal mucosa are pink and moist without lesions or exudates. Mouth:  Oral mucosa and oropharynx without lesions or exudates.  Teeth in good repair. Lungs:  clear to auscultation.  no respiratory distress  Heart:  regular rate and rhythm, S1, S2 without murmurs, rubs, gallops, or clicks Abdomen:  Bowel sounds positive,abdomen soft and non-tender without masses, organomegaly or hernias noted. Msk:  No deformity or scoliosis noted of thoracic or lumbar spine.   Extremities:  no edema B Neurologic:  No cranial nerve deficits noted. Station and gait are normal. Plantar reflexes are down-going bilaterally. DTRs are symmetrical throughout. Sensory, motor and coordinative functions appear intact. Skin:  There is 2 symmetric oval subcut infiltrates on B lat distal shins 8x6 cm and one poster 3x2 cm infiltrate on R distal shin. Psych:  Oriented X3, good eye contact, not anxious appearing, and not depressed appearing.     Impression & Recommendations:  Problem # 1:  SKIN RASH (ICD-782.1) B ankles - ?  E. nodozum Assessment New D/c HCTZ Her updated medication list for this problem includes:    Triamcinolone Acetonide 0.5 % Crea (Triamcinolone acetonide) .Marland Kitchen... Apply bid to affected area    Triamcinolone Acetonide 0.5 % Crea (Triamcinolone acetonide) ..... Use two times a day prn  Problem # 2:  HYPOTHYROIDISM (ICD-244.9) Assessment: Improved  Her updated medication list for this problem  includes:    Synthroid 50 Mcg Tabs (Levothyroxine sodium) .Marland Kitchen... 1 by mouth once daily  Problem # 3:  DEPRESSION/ANXIETY (ICD-300.4) Assessment: Improved  Problem # 4:  HYPERTENSION (ICD-401.9) Assessment: Unchanged  The following medications were removed from the medication list:    Hydrochlorothiazide 12.5 Mg Caps (Hydrochlorothiazide) .Marland Kitchen... 1 by mouth once daily for blood pressure Her updated medication list for this problem includes:    Losartan Potassium 100 Mg Tabs (Losartan potassium) .Marland Kitchen... 1 by mouth once daily for blood pressure  Complete Medication List: 1)  Loratadine 10 Mg Tabs (Loratadine) .... Once daily as needed allergies 2)  Triamcinolone Acetonide 0.5 % Crea (Triamcinolone acetonide) .... Apply bid to affected area 3)  Vitamin D3 1000 Unit Tabs (Cholecalciferol) .Marland Kitchen.. 1 by mouth daily 4)  Zolpidem Tartrate 10 Mg Tabs (Zolpidem tartrate) .... 1/2-1 tab at bedtime as needed insomnia 5)  Citalopram Hydrobromide 20 Mg Tabs (Citalopram hydrobromide) .Marland Kitchen.. 1 by mouth once daily for depression 6)  Vitamin D (ergocalciferol) 50000 Unit Caps (Ergocalciferol) .Marland Kitchen.. 1 by mouth weekly for 6 weeks 7)  Synthroid 50 Mcg Tabs (Levothyroxine sodium) .Marland Kitchen.. 1 by mouth once daily 8)  Losartan Potassium 100 Mg Tabs (Losartan potassium) .Marland Kitchen.. 1 by mouth once daily for blood pressure 9)  Triamcinolone Acetonide 0.5 % Crea (Triamcinolone acetonide) .... Use two times a day prn  Patient Instructions: 1)  Stop HCTZ 2)  Start Losartan 3)  Please schedule a follow-up appointment in 6-8 weeks. 4)  BMP prior to visit, ICD-9: 5)  TSH prior to visit, ICD-9: 6)  Vit D 268.90  244.80 7)  ESR 8)  ASO  782.1 Prescriptions: TRIAMCINOLONE ACETONIDE 0.5 % CREA (TRIAMCINOLONE ACETONIDE) use two times a day prn  #90 g x 3   Entered and Authorized by:   Tresa Garter MD   Signed by:   Tresa Garter MD on 04/13/2010   Method used:   Electronically to        [facility]*  (retail)       [address]       [address] Shipping/mailing       [place], [place]  [address]       Ph: [phone]       Fax: [phone]   RxID:   [id number] LOSARTAN POTASSIUM 100 MG TABS (LOSARTAN POTASSIUM) 1 by mouth once daily for blood pressure  #30 x 12   Entered and Authorized by:   Tresa Garter MD   Signed by:   Tresa Garter MD on 04/13/2010   Method used:   Electronically to        [facility]* (retail)       [address]       [address] Shipping/mailing       [place], [place]  [address]       Ph: [phone]       Fax: [phone]   RxID:   [id number]    Orders Added: 1)  Est. Patient Level IV [32202]

## 2010-05-16 ENCOUNTER — Telehealth: Payer: Self-pay | Admitting: Internal Medicine

## 2010-05-18 ENCOUNTER — Other Ambulatory Visit: Payer: Self-pay

## 2010-05-22 ENCOUNTER — Encounter: Payer: Self-pay | Admitting: Cardiology

## 2010-05-22 ENCOUNTER — Other Ambulatory Visit: Payer: Self-pay | Admitting: Internal Medicine

## 2010-05-22 ENCOUNTER — Encounter: Payer: Self-pay | Admitting: Internal Medicine

## 2010-05-22 ENCOUNTER — Other Ambulatory Visit (INDEPENDENT_AMBULATORY_CARE_PROVIDER_SITE_OTHER): Payer: Commercial Managed Care - PPO

## 2010-05-22 DIAGNOSIS — R030 Elevated blood-pressure reading, without diagnosis of hypertension: Secondary | ICD-10-CM

## 2010-05-22 DIAGNOSIS — E039 Hypothyroidism, unspecified: Secondary | ICD-10-CM

## 2010-05-22 LAB — HEPATIC FUNCTION PANEL
ALT: 14 U/L (ref 0–35)
AST: 18 U/L (ref 0–37)
Albumin: 4.2 g/dL (ref 3.5–5.2)
Alkaline Phosphatase: 90 U/L (ref 39–117)
Total Bilirubin: 0.5 mg/dL (ref 0.3–1.2)

## 2010-05-22 LAB — CBC WITH DIFFERENTIAL/PLATELET
Basophils Absolute: 0 10*3/uL (ref 0.0–0.1)
Basophils Relative: 0.4 % (ref 0.0–3.0)
Eosinophils Absolute: 0.1 10*3/uL (ref 0.0–0.7)
MCHC: 34.4 g/dL (ref 30.0–36.0)
MCV: 88.2 fl (ref 78.0–100.0)
Monocytes Absolute: 0.5 10*3/uL (ref 0.1–1.0)
Neutrophils Relative %: 53 % (ref 43.0–77.0)
Platelets: 276 10*3/uL (ref 150.0–400.0)
RBC: 4.67 Mil/uL (ref 3.87–5.11)
RDW: 13.2 % (ref 11.5–14.6)

## 2010-05-22 LAB — URINALYSIS
Ketones, ur: NEGATIVE
Specific Gravity, Urine: <=1.005 (ref 1.000–1.030)
Urine Glucose: NEGATIVE
pH: 6.5 (ref 5.0–8.0)

## 2010-05-22 LAB — BASIC METABOLIC PANEL
BUN: 12 mg/dL (ref 6–23)
CO2: 28 mEq/L (ref 19–32)
Calcium: 9.1 mg/dL (ref 8.4–10.5)
Chloride: 107 mEq/L (ref 96–112)
Creatinine, Ser: 0.6 mg/dL (ref 0.4–1.2)
Glucose, Bld: 79 mg/dL (ref 70–99)

## 2010-05-22 LAB — LIPID PANEL
Total CHOL/HDL Ratio: 3
Triglycerides: 46 mg/dL (ref 0.0–149.0)

## 2010-05-22 NOTE — Progress Notes (Signed)
Summary: NEED LAB APT  Phone Note Other Incoming   Caller: pt [phone] Summary of Call: Pt has a physical sch for next week, she would like physical labs put in so she can come this weeks. Please Advise  Follow-up for Phone Call        ok Will order CBC, TSH, BMET, Hepatic panel, UA, Lipids, Dx: V70.0,and Vit D Dx 268.90 Follow-up by: Tresa Garter MD,  May 16, 2010 1:37 PM  Additional Follow-up for Phone Call Additional follow up Details #1::        Please help schedule pt for above lab apt, THANK YOU!  Additional Follow-up by: Lamar Sprinkles, CMA,  May 16, 2010 4:21 PM    Additional Follow-up for Phone Call Additional follow up Details #2::    lab orders in EPIC,appt in EPIC was sched as f/u appt not a cpx. Follow-up by: Verdell Face,  May 17, 2010 9:35 AM  Additional Follow-up for Phone Call Additional follow up Details #3:: Details for Additional Follow-up Action Taken: Dr Macario Golds - pt was scheduled for f/u 15 min NOT CPX - would you like pt to be rescheduled?................Marland KitchenLamar Sprinkles, CMA  May 17, 2010 12:59 PM   No, that's ok Thank you!  Additional Follow-up by: Tresa Garter MD,  May 17, 2010 3:42 PM

## 2010-05-23 ENCOUNTER — Encounter: Payer: Self-pay | Admitting: Internal Medicine

## 2010-05-23 ENCOUNTER — Ambulatory Visit (INDEPENDENT_AMBULATORY_CARE_PROVIDER_SITE_OTHER): Payer: Commercial Managed Care - PPO | Admitting: Internal Medicine

## 2010-05-23 DIAGNOSIS — R21 Rash and other nonspecific skin eruption: Secondary | ICD-10-CM

## 2010-05-23 DIAGNOSIS — E559 Vitamin D deficiency, unspecified: Secondary | ICD-10-CM

## 2010-05-23 DIAGNOSIS — I10 Essential (primary) hypertension: Secondary | ICD-10-CM

## 2010-05-23 DIAGNOSIS — E039 Hypothyroidism, unspecified: Secondary | ICD-10-CM

## 2010-05-29 ENCOUNTER — Telehealth: Payer: Self-pay | Admitting: Internal Medicine

## 2010-06-05 NOTE — Progress Notes (Signed)
Summary: Dizzy  Phone Note Call from Patient   Summary of Call: Pt was seen last week. She c/o dizzyness and wants md's suggestion. Symptoms off and off since starting new meds. I advised pt she may want to stop doxy x 1 day to see if symptoms decrease or stop. She is aware MD is out until Thursday and wants to wait, pt will call w/any change in symptoms.  Initial call taken by: Lamar Sprinkles, CMA,  May 29, 2010 10:15 AM  Follow-up for Phone Call        Agree. She can also try Losartan 1/2 a day Follow-up by: Tresa Garter MD,  May 30, 2010 10:59 AM  Additional Follow-up for Phone Call Additional follow up Details #1::        pt not avail......................Marland KitchenLamar Sprinkles, CMA  May 30, 2010 11:14 AM   No answer on hm or wk #.................Marland KitchenLamar Sprinkles, CMA  May 30, 2010 4:26 PM   Pt informed  Additional Follow-up by: Lamar Sprinkles, CMA,  June 01, 2010 10:21 AM

## 2010-06-05 NOTE — Assessment & Plan Note (Signed)
Summary: fu/lb   Vital Signs:  Patient profile:   50 year old female Height:      63 inches Weight:      185 pounds BMI:     32.89 Temp:     98.9 degrees F oral Pulse rate:   76 / minute Pulse rhythm:   regular Resp:     16 per minute BP sitting:   120 / 80  (left arm) Cuff size:   large  Vitals Entered By: Lanier Prude, CMA(AAMA) (May 23, 2010 7:58 AM) CC: 1 mo f/u  Is Patient Diabetic? No   Primary Care Provider:  Tresa Garter MD  CC:  1 mo f/u .  History of Present Illness: F/u rash - not better and to f/u on HTN, depression, hypothyroidism  Current Medications (verified): 1)  Loratadine 10 Mg  Tabs (Loratadine) .... Once Daily As Needed Allergies 2)  Triamcinolone Acetonide 0.5 % Crea (Triamcinolone Acetonide) .... Apply Bid To Affected Area 3)  Vitamin D3 1000 Unit  Tabs (Cholecalciferol) .Marland Kitchen.. 1 By Mouth Daily 4)  Zolpidem Tartrate 10 Mg Tabs (Zolpidem Tartrate) .... 1/2-1 Tab At Bedtime As Needed Insomnia 5)  Citalopram Hydrobromide 20 Mg Tabs (Citalopram Hydrobromide) .Marland Kitchen.. 1 By Mouth Once Daily For Depression 6)  Synthroid 50 Mcg Tabs (Levothyroxine Sodium) .Marland Kitchen.. 1 By Mouth Once Daily 7)  Losartan Potassium 100 Mg Tabs (Losartan Potassium) .Marland Kitchen.. 1 By Mouth Once Daily For Blood Pressure 8)  Triamcinolone Acetonide 0.5 % Crea (Triamcinolone Acetonide) .... Use Two Times A Day Prn  Allergies (verified): 1)  ! Sulfa 2)  ! Penicillin  Past History:  Past Medical History: Last updated: 04/13/2010 ACCIDENTAL FALL ON OR FROM OTHER STAIRS OR STEPS (ICD-E880.9) LOW BACK PAIN (ICD-724.2) LEG PAIN (ICD-729.5) CONTUSION, HEAD (ICD-920) PRURITUS (ICD-698.9) ALLERGIC RHINITIS (ICD-477.9) RASH AND OTHER NONSPECIFIC SKIN ERUPTION (ICD-782.1) VITAMIN D DEFICIENCY (ICD-268.9) ARM PAIN (ICD-729.5) CHEST PAIN, UNSPECIFIED (ICD-786.50) GOITER, MULTINODULAR (ICD-241.1) s/p  XRT Iodine Rx 2011 Dr Everardo All NECK PAIN (ICD-723.1) Stress disorder 2011 Hypothyroidism  2011 Hypertension  Past Surgical History: Last updated: 02/10/2007 Hysterectomy  Social History: Last updated: 04/03/2007 Occupation: [employer] Married Nonsmoker  Review of Systems  The patient denies fever, dyspnea on exertion, prolonged cough, headaches, and abdominal pain.         sweats  Physical Exam  General:  In moderate distress due to being upset - crying.    Nose:  External nasal examination shows no deformity or inflammation. Nasal mucosa are pink and moist without lesions or exudates. Mouth:  Oral mucosa and oropharynx without lesions or exudates.  Teeth in good repair. Neck:  No deformities, masses, or tenderness noted. Lungs:  clear to auscultation.  no respiratory distress  Heart:  regular rate and rhythm, S1, S2 without murmurs, rubs, gallops, or clicks Abdomen:  Bowel sounds positive,abdomen soft and non-tender without masses, organomegaly or hernias noted. Msk:  No deformity or scoliosis noted of thoracic or lumbar spine.   Neurologic:  No cranial nerve deficits noted. Station and gait are normal. Plantar reflexes are down-going bilaterally. DTRs are symmetrical throughout. Sensory, motor and coordinative functions appear intact. Skin:  There is 2 symmetric oval subcut infiltrates on B lat distal shins 8x6 cm and one poster 3x2 cm infiltrate on R distal shin. Psych:  Oriented X3, good eye contact, not anxious appearing, and not depressed appearing.     Impression & Recommendations:  Problem # 1:  SKIN RASH (ICD-782.1) LE B _ unclear etiol. Assessment Unchanged  Her updated medication list for this problem includes:    Triamcinolone Acetonide 0.5 % Crea (Triamcinolone acetonide) .Marland Kitchen... Apply bid to affected area    Orders: Dermatology Referral (Derma)  Problem # 2:  EDEMA (ICD-782.3) Assessment: Improved  Problem # 3:  HYPOTHYROIDISM (ICD-244.9) Assessment: Unchanged  Her updated medication list for this problem includes:    Synthroid 50 Mcg  Tabs (Levothyroxine sodium) .Marland Kitchen... 1 by mouth once daily  Problem # 4:  STRESS DISORDER (ICD-V62.89) Assessment: Improved  Problem # 5:  VITAMIN D DEFICIENCY (ICD-268.9) Assessment: Deteriorated Start Vit D2 50 000 iu weekly for 6 weeks; when finished, start Vit D3   1000 iu daily.   Problem # 6:  HYPERTENSION (ICD-401.9) Assessment: Comment Only  Her updated medication list for this problem includes:    Losartan Potassium 100 Mg Tabs (Losartan potassium) .Marland Kitchen... 1 by mouth once daily for blood pressure  Complete Medication List: 1)  Loratadine 10 Mg Tabs (Loratadine) .... Once daily as needed allergies 2)  Triamcinolone Acetonide 0.5 % Crea (Triamcinolone acetonide) .... Apply bid to affected area 3)  Vitamin D3 1000 Unit Tabs (Cholecalciferol) .... 2 by mouth daily 4)  Zolpidem Tartrate 10 Mg Tabs (Zolpidem tartrate) .... 1/2-1 tab at bedtime as needed insomnia 5)  Citalopram Hydrobromide 20 Mg Tabs (Citalopram hydrobromide) .Marland Kitchen.. 1 by mouth once daily for depression 6)  Synthroid 50 Mcg Tabs (Levothyroxine sodium) .Marland Kitchen.. 1 by mouth once daily 7)  Losartan Potassium 100 Mg Tabs (Losartan potassium) .Marland Kitchen.. 1 by mouth once daily for blood pressure 8)  Triamcinolone Acetonide 0.5 % Crea (Triamcinolone acetonide) .... Use two times a day prn 9)  Doxycycline Hyclate 100 Mg Caps (Doxycycline hyclate) .Marland Kitchen.. 1 by mouth two times a day with a glass of water 10)  Vitamin D (ergocalciferol) 50000 Unit Caps (Ergocalciferol) .Marland Kitchen.. 1 by mouth q 1 week x 6 weeks then start vit d 2000 international units qd  Patient Instructions: 1)  Please schedule a follow-up appointment in 4 months. 2)  BMP prior to visit, ICD-9: 3)  vit D 268.9 Prescriptions: VITAMIN D (ERGOCALCIFEROL) 50000 UNIT CAPS (ERGOCALCIFEROL) 1 by mouth q 1 week x 6 weeks then start Vit D 2000 international units qd  #6 x 0   Entered and Authorized by:   Tresa Garter MD   Signed by:   Tresa Garter MD on 05/23/2010   Method used:    Electronically to        [facility]* (retail)       [address]       [address] Shipping/mailing       [place], [place]  [address]       Ph: [phone]       Fax: [phone]   RxID:   [id number] DOXYCYCLINE HYCLATE 100 MG CAPS (DOXYCYCLINE HYCLATE) 1 by mouth two times a day with a glass of water  #40 x 0   Entered and Authorized by:   Tresa Garter MD   Signed by:   Tresa Garter MD on 05/23/2010   Method used:   Electronically to        [facility]* (retail)       [address]       [address] Shipping/mailing       [place], [place]  [address]       Ph: [phone]       Fax: [phone]   RxID:  [id number]    Orders Added: 1)  Dermatology Referral [Derma] 2)  Est. Patient Level IV [95284]

## 2010-07-03 LAB — HCG, SERUM, QUALITATIVE: Preg, Serum: NEGATIVE

## 2010-07-26 ENCOUNTER — Telehealth: Payer: Self-pay | Admitting: *Deleted

## 2010-07-26 DIAGNOSIS — E039 Hypothyroidism, unspecified: Secondary | ICD-10-CM

## 2010-07-26 NOTE — Telephone Encounter (Signed)
Patient requesting lab orders - scheduled for ov coming up soon.

## 2010-07-26 NOTE — Telephone Encounter (Signed)
Ok labs thx

## 2010-07-26 NOTE — Telephone Encounter (Signed)
Patient informed. 

## 2010-08-07 ENCOUNTER — Encounter: Payer: Self-pay | Admitting: Internal Medicine

## 2010-08-09 ENCOUNTER — Ambulatory Visit: Payer: Commercial Managed Care - PPO | Admitting: Internal Medicine

## 2010-10-10 ENCOUNTER — Other Ambulatory Visit: Payer: Self-pay | Admitting: *Deleted

## 2010-10-10 ENCOUNTER — Other Ambulatory Visit (INDEPENDENT_AMBULATORY_CARE_PROVIDER_SITE_OTHER): Payer: 59 | Admitting: *Deleted

## 2010-10-10 DIAGNOSIS — M545 Low back pain, unspecified: Secondary | ICD-10-CM

## 2010-10-10 DIAGNOSIS — E559 Vitamin D deficiency, unspecified: Secondary | ICD-10-CM

## 2010-10-10 DIAGNOSIS — I10 Essential (primary) hypertension: Secondary | ICD-10-CM

## 2010-10-10 DIAGNOSIS — E0590 Thyrotoxicosis, unspecified without thyrotoxic crisis or storm: Secondary | ICD-10-CM

## 2010-10-10 LAB — COMPREHENSIVE METABOLIC PANEL
ALT: 15 U/L (ref 0–35)
AST: 20 U/L (ref 0–37)
BUN: 12 mg/dL (ref 6–23)
CO2: 26 mEq/L (ref 19–32)
Calcium: 9 mg/dL (ref 8.4–10.5)
Glucose, Bld: 100 mg/dL — ABNORMAL HIGH (ref 70–99)
Potassium: 5 mEq/L (ref 3.5–5.1)
Total Bilirubin: 0.6 mg/dL (ref 0.3–1.2)
Total Protein: 7.5 g/dL (ref 6.0–8.3)

## 2010-10-10 LAB — URINALYSIS
Bilirubin Urine: NEGATIVE
Ketones, ur: NEGATIVE
Total Protein, Urine: NEGATIVE
Urine Glucose: NEGATIVE
Urobilinogen, UA: 0.2 (ref 0.0–1.0)

## 2010-10-10 LAB — LIPID PANEL
Cholesterol: 177 mg/dL (ref 0–200)
HDL: 64 mg/dL (ref >39.00)
VLDL: 7.2 mg/dL (ref 0.0–40.0)

## 2010-10-10 LAB — TSH: TSH: 0.3 u[IU]/mL — ABNORMAL LOW (ref 0.35–5.50)

## 2010-10-12 ENCOUNTER — Encounter: Payer: Self-pay | Admitting: Internal Medicine

## 2010-10-12 ENCOUNTER — Ambulatory Visit (INDEPENDENT_AMBULATORY_CARE_PROVIDER_SITE_OTHER): Payer: 59 | Admitting: Internal Medicine

## 2010-10-12 ENCOUNTER — Ambulatory Visit: Payer: Commercial Managed Care - PPO | Admitting: Internal Medicine

## 2010-10-12 ENCOUNTER — Other Ambulatory Visit: Payer: Self-pay | Admitting: *Deleted

## 2010-10-12 DIAGNOSIS — I10 Essential (primary) hypertension: Secondary | ICD-10-CM

## 2010-10-12 DIAGNOSIS — E559 Vitamin D deficiency, unspecified: Secondary | ICD-10-CM

## 2010-10-12 DIAGNOSIS — R21 Rash and other nonspecific skin eruption: Secondary | ICD-10-CM

## 2010-10-12 DIAGNOSIS — E039 Hypothyroidism, unspecified: Secondary | ICD-10-CM

## 2010-10-12 MED ORDER — LEVOTHYROXINE SODIUM 50 MCG PO TABS: 50.0000 ug | ORAL_TABLET | Freq: Every day | ORAL | Status: DC

## 2010-10-12 MED ORDER — CHOLECALCIFEROL 125 MCG (5000 UT) PO CAPS: 5000.0000 [IU] | ORAL_CAPSULE | Freq: Every day | ORAL | Status: DC

## 2010-10-12 MED ORDER — ERGOCALCIFEROL 1.25 MG (50000 UT) PO CAPS: 50000.0000 [IU] | ORAL_CAPSULE | ORAL | Status: DC

## 2010-10-12 MED ORDER — LOSARTAN POTASSIUM 50 MG PO TABS: 50.0000 mg | ORAL_TABLET | Freq: Every day | ORAL | Status: DC

## 2010-10-12 NOTE — Assessment & Plan Note (Signed)
Increase Vit D 

## 2010-10-12 NOTE — Assessment & Plan Note (Signed)
On Rx 

## 2010-10-12 NOTE — Assessment & Plan Note (Signed)
Will watch 

## 2010-10-12 NOTE — Assessment & Plan Note (Signed)
Cont Rx Monitor labs

## 2010-10-12 NOTE — Progress Notes (Signed)
  Subjective:    Patient ID: Krystal Gibbs, female    DOB: 08-17-1960, 50 y.o.   MRN: [id number]  HPI  The patient presents for a follow-up of  chronic hypertension, chronic dyslipidemia, hypothyroidism controlled with medicines C/o rash on B LEs - seen Derm   Review of Systems  Constitutional: Negative for chills, activity change, appetite change, fatigue and unexpected weight change.  HENT: Negative for congestion, mouth sores and sinus pressure.   Eyes: Negative for visual disturbance.  Respiratory: Negative for cough and chest tightness.   Gastrointestinal: Negative for nausea and abdominal pain.  Genitourinary: Negative for frequency, difficulty urinating and vaginal pain.  Musculoskeletal: Negative for back pain and gait problem.  Skin: Positive for rash. Negative for pallor.  Neurological: Negative for dizziness, tremors, weakness, numbness and headaches.  Psychiatric/Behavioral: Negative for confusion and sleep disturbance.       Objective:   Physical Exam  Constitutional: She appears well-developed. No distress.       Obese   HENT:  Head: Normocephalic.  Right Ear: External ear normal.  Left Ear: External ear normal.  Nose: Nose normal.  Mouth/Throat: Oropharynx is clear and moist.  Eyes: Conjunctivae are normal. Pupils are equal, round, and reactive to light. Right eye exhibits no discharge. Left eye exhibits no discharge.  Neck: Normal range of motion. Neck supple. No JVD present. No tracheal deviation present. No thyromegaly present.  Cardiovascular: Normal rate, regular rhythm and normal heart sounds.   Pulmonary/Chest: No stridor. No respiratory distress. She has no wheezes.  Abdominal: Soft. Bowel sounds are normal. She exhibits no distension and no mass. There is no tenderness. There is no rebound and no guarding.  Musculoskeletal: She exhibits no edema and no tenderness.  Lymphadenopathy:    She has no cervical adenopathy.  Neurological: She displays  normal reflexes. No cranial nerve deficit. She exhibits normal muscle tone. Coordination normal.  Skin: Rash (B lat dist shins) noted. No erythema.  Psychiatric: She has a normal mood and affect. Her behavior is normal. Judgment and thought content normal.          Assessment & Plan:

## 2010-12-03 ENCOUNTER — Telehealth: Payer: Self-pay | Admitting: *Deleted

## 2010-12-03 NOTE — Telephone Encounter (Signed)
Pt is req rx for low dose alprazolam for a trip to [place] she is taking next week. Please advise.

## 2010-12-04 MED ORDER — ALPRAZOLAM 0.25 MG PO TABS: 0.2500 mg | ORAL_TABLET | Freq: Two times a day (BID) | ORAL | Status: AC

## 2010-12-04 NOTE — Telephone Encounter (Signed)
OK. Done Thx 

## 2010-12-04 NOTE — Telephone Encounter (Signed)
Rx faxed to pharmacy Pt informed 

## 2010-12-05 ENCOUNTER — Telehealth: Payer: Self-pay | Admitting: *Deleted

## 2010-12-05 MED ORDER — AZITHROMYCIN 250 MG PO TABS: ORAL_TABLET | ORAL | Status: AC

## 2010-12-05 NOTE — Telephone Encounter (Signed)
Pt is req Rx for abx. She c/o sinus/facial pain and thinks she has a sinus infection. Please advise.

## 2010-12-05 NOTE — Telephone Encounter (Signed)
OK Zpac Thx 

## 2010-12-05 NOTE — Telephone Encounter (Signed)
Patient informed. 

## 2011-04-19 ENCOUNTER — Other Ambulatory Visit: Payer: Self-pay | Admitting: Internal Medicine

## 2011-04-30 ENCOUNTER — Telehealth: Payer: Self-pay | Admitting: *Deleted

## 2011-04-30 DIAGNOSIS — E538 Deficiency of other specified B group vitamins: Secondary | ICD-10-CM

## 2011-04-30 DIAGNOSIS — R030 Elevated blood-pressure reading, without diagnosis of hypertension: Secondary | ICD-10-CM

## 2011-04-30 DIAGNOSIS — K769 Liver disease, unspecified: Secondary | ICD-10-CM

## 2011-04-30 DIAGNOSIS — E0590 Thyrotoxicosis, unspecified without thyrotoxic crisis or storm: Secondary | ICD-10-CM

## 2011-04-30 NOTE — Telephone Encounter (Signed)
OK CBC, CMET, TSH, B12, UA Thx

## 2011-04-30 NOTE — Telephone Encounter (Signed)
Pt is requesting to have lab orders placed for "everything". She states she has been felling "yuckY and specifically wants her thyroid checked. Please advise which other labs?

## 2011-05-01 NOTE — Telephone Encounter (Signed)
Pt informed/ labs entered.  

## 2011-05-02 ENCOUNTER — Ambulatory Visit (INDEPENDENT_AMBULATORY_CARE_PROVIDER_SITE_OTHER): Payer: 59 | Admitting: *Deleted

## 2011-05-02 ENCOUNTER — Telehealth: Payer: Self-pay | Admitting: Internal Medicine

## 2011-05-02 ENCOUNTER — Telehealth: Payer: Self-pay

## 2011-05-02 DIAGNOSIS — E0590 Thyrotoxicosis, unspecified without thyrotoxic crisis or storm: Secondary | ICD-10-CM

## 2011-05-02 DIAGNOSIS — K769 Liver disease, unspecified: Secondary | ICD-10-CM

## 2011-05-02 DIAGNOSIS — E538 Deficiency of other specified B group vitamins: Secondary | ICD-10-CM

## 2011-05-02 DIAGNOSIS — R030 Elevated blood-pressure reading, without diagnosis of hypertension: Secondary | ICD-10-CM

## 2011-05-02 DIAGNOSIS — I10 Essential (primary) hypertension: Secondary | ICD-10-CM

## 2011-05-02 DIAGNOSIS — E042 Nontoxic multinodular goiter: Secondary | ICD-10-CM

## 2011-05-02 LAB — COMPREHENSIVE METABOLIC PANEL
ALT: 13 U/L (ref 0–35)
AST: 19 U/L (ref 0–37)
CO2: 29 mEq/L (ref 19–32)
Calcium: 9.5 mg/dL (ref 8.4–10.5)
Chloride: 106 mEq/L (ref 96–112)
Creatinine, Ser: 0.6 mg/dL (ref 0.4–1.2)
GFR: 134.67 mL/min (ref >60.00)
Sodium: 142 mEq/L (ref 135–145)
Total Protein: 7.2 g/dL (ref 6.0–8.3)

## 2011-05-02 LAB — URINALYSIS, ROUTINE W REFLEX MICROSCOPIC
Bilirubin Urine: NEGATIVE
Hgb urine dipstick: NEGATIVE
Ketones, ur: NEGATIVE
Total Protein, Urine: NEGATIVE
Urine Glucose: NEGATIVE
Urobilinogen, UA: 0.2 (ref 0.0–1.0)

## 2011-05-02 LAB — T4, FREE: Free T4: 1.19 ng/dL (ref 0.60–1.60)

## 2011-05-02 LAB — TSH: TSH: 0.24 u[IU]/mL — ABNORMAL LOW (ref 0.35–5.50)

## 2011-05-02 LAB — VITAMIN B12: Vitamin B-12: 243 pg/mL (ref 211–911)

## 2011-05-02 MED ORDER — VITAMIN B-12 500 MCG SL SUBL: 1.0000 | SUBLINGUAL_TABLET | SUBLINGUAL | Status: DC

## 2011-05-02 NOTE — Telephone Encounter (Signed)
Krystal Gibbs, please, inform patient that all labs are normal except for borderline low vit b12 Start B12

## 2011-05-02 NOTE — Telephone Encounter (Signed)
PT signed ROI, Pick up Copy of Labs  05/02/11/KM

## 2011-05-03 NOTE — Telephone Encounter (Signed)
No answer at either number- will try again later.

## 2011-05-04 NOTE — Telephone Encounter (Signed)
Pt informed

## 2011-05-22 ENCOUNTER — Other Ambulatory Visit: Payer: Self-pay | Admitting: *Deleted

## 2011-05-22 ENCOUNTER — Other Ambulatory Visit: Payer: Self-pay | Admitting: Internal Medicine

## 2011-05-22 MED ORDER — LOSARTAN POTASSIUM 50 MG PO TABS: 50.0000 mg | ORAL_TABLET | Freq: Every day | ORAL | Status: DC

## 2011-06-14 ENCOUNTER — Encounter: Payer: Self-pay | Admitting: Internal Medicine

## 2011-07-05 ENCOUNTER — Ambulatory Visit (INDEPENDENT_AMBULATORY_CARE_PROVIDER_SITE_OTHER): Payer: 59 | Admitting: Internal Medicine

## 2011-07-05 ENCOUNTER — Encounter: Payer: Self-pay | Admitting: Internal Medicine

## 2011-07-05 ENCOUNTER — Ambulatory Visit: Payer: 59 | Admitting: Internal Medicine

## 2011-07-05 VITALS — BP 160/98 | HR 80 | Temp 98.2°F | Resp 16 | Wt 185.0 lb

## 2011-07-05 DIAGNOSIS — F341 Dysthymic disorder: Secondary | ICD-10-CM

## 2011-07-05 DIAGNOSIS — E559 Vitamin D deficiency, unspecified: Secondary | ICD-10-CM

## 2011-07-05 DIAGNOSIS — E538 Deficiency of other specified B group vitamins: Secondary | ICD-10-CM

## 2011-07-05 DIAGNOSIS — E0590 Thyrotoxicosis, unspecified without thyrotoxic crisis or storm: Secondary | ICD-10-CM

## 2011-07-05 DIAGNOSIS — I10 Essential (primary) hypertension: Secondary | ICD-10-CM

## 2011-07-05 NOTE — Assessment & Plan Note (Signed)
Resolved

## 2011-07-05 NOTE — Assessment & Plan Note (Signed)
F/u w/Dr Ellison 

## 2011-07-05 NOTE — Assessment & Plan Note (Signed)
Start B12

## 2011-07-05 NOTE — Progress Notes (Signed)
Patient ID: Krystal Gibbs, female   DOB: 01/09/1961, 51 y.o.   MRN: [id number]  Subjective:    Patient ID: Krystal Gibbs, female    DOB: 10/23/1960, 51 y.o.   MRN: [id number]  HPI  The patient presents for a follow-up of  chronic hypertension, chronic dyslipidemia, hypothyroidism controlled with medicines C/o rash on B LEs - she had seen Derm - not better  BP Readings from Last 3 Encounters:  07/05/11 160/98  10/12/10 130/80  05/23/10 120/80   Wt Readings from Last 3 Encounters:  07/05/11 185 lb (83.915 kg)  10/12/10 191 lb (86.637 kg)  05/23/10 185 lb (83.915 kg)       Review of Systems  Constitutional: Negative for chills, activity change, appetite change, fatigue and unexpected weight change.  HENT: Negative for congestion, mouth sores and sinus pressure.   Eyes: Negative for visual disturbance.  Respiratory: Negative for cough and chest tightness.   Gastrointestinal: Negative for nausea and abdominal pain.  Genitourinary: Negative for frequency, difficulty urinating and vaginal pain.  Musculoskeletal: Negative for back pain and gait problem.  Skin: Positive for rash. Negative for pallor.  Neurological: Negative for dizziness, tremors, weakness, numbness and headaches.  Psychiatric/Behavioral: Negative for confusion and sleep disturbance.       Objective:   Physical Exam  Constitutional: She appears well-developed. No distress.       Obese   HENT:  Head: Normocephalic.  Right Ear: External ear normal.  Left Ear: External ear normal.  Nose: Nose normal.  Mouth/Throat: Oropharynx is clear and moist.  Eyes: Conjunctivae are normal. Pupils are equal, round, and reactive to light. Right eye exhibits no discharge. Left eye exhibits no discharge.  Neck: Normal range of motion. Neck supple. No JVD present. No tracheal deviation present. Thyromegaly (nodular goiter) present.  Cardiovascular: Normal rate, regular rhythm and normal heart sounds.   Pulmonary/Chest: No  stridor. No respiratory distress. She has no wheezes.  Abdominal: Soft. Bowel sounds are normal. She exhibits no distension and no mass. There is no tenderness. There is no rebound and no guarding.  Musculoskeletal: She exhibits no edema and no tenderness.  Lymphadenopathy:    She has no cervical adenopathy.  Neurological: She displays normal reflexes. No cranial nerve deficit. She exhibits normal muscle tone. Coordination normal.  Skin: Rash (B lat dist shins) noted. No erythema.  Psychiatric: She has a normal mood and affect. Her behavior is normal. Judgment and thought content normal.   Lab Results  Component Value Date   WBC 5.3 05/22/2010   HGB 14.2 05/22/2010   HCT 41.2 05/22/2010   PLT 276.0 05/22/2010   GLUCOSE 89 05/02/2011   CHOL 177 10/10/2010   TRIG 36.0 10/10/2010   HDL 64.00 10/10/2010   LDLDIRECT 98.4 06/02/2006   LDLCALC 106* 10/10/2010   ALT 13 05/02/2011   AST 19 05/02/2011   NA 142 05/02/2011   K 5.0 05/02/2011   CL 106 05/02/2011   CREATININE 0.6 05/02/2011   BUN 10 05/02/2011   CO2 29 05/02/2011   TSH 0.24* 05/02/2011          Assessment & Plan:

## 2011-07-05 NOTE — Assessment & Plan Note (Signed)
4/13 - worse - cut back on salt; start Cozaar 1 tab (not 1/2 a day)

## 2011-07-05 NOTE — Assessment & Plan Note (Signed)
Continue with current prescription therapy as reflected on the Med list.  

## 2011-07-19 ENCOUNTER — Telehealth: Payer: Self-pay

## 2011-07-19 DIAGNOSIS — Z0000 Encounter for general adult medical examination without abnormal findings: Secondary | ICD-10-CM

## 2011-07-19 NOTE — Telephone Encounter (Signed)
PHYSICAL LABS IN OCT entered in epic

## 2011-07-30 ENCOUNTER — Other Ambulatory Visit: Payer: Self-pay | Admitting: *Deleted

## 2011-07-30 MED ORDER — LOSARTAN POTASSIUM 50 MG PO TABS: 50.0000 mg | ORAL_TABLET | Freq: Every day | ORAL | Status: DC

## 2011-08-06 ENCOUNTER — Other Ambulatory Visit: Payer: Self-pay | Admitting: Internal Medicine

## 2011-09-16 ENCOUNTER — Ambulatory Visit: Payer: 59 | Admitting: Internal Medicine

## 2011-10-04 ENCOUNTER — Ambulatory Visit: Payer: 59 | Admitting: Internal Medicine

## 2011-10-21 ENCOUNTER — Other Ambulatory Visit: Payer: Self-pay | Admitting: Internal Medicine

## 2012-01-03 ENCOUNTER — Encounter: Payer: 59 | Admitting: Internal Medicine

## 2012-01-13 ENCOUNTER — Telehealth: Payer: Self-pay

## 2012-01-13 NOTE — Telephone Encounter (Signed)
Patient has appt 01/20/12 (6mos follow up) and would like to have labs entered prior to appt. Thanks

## 2012-01-14 NOTE — Telephone Encounter (Signed)
Patient notified

## 2012-01-14 NOTE — Telephone Encounter (Signed)
Lab orders were entered in 4/13 Thx

## 2012-01-17 ENCOUNTER — Other Ambulatory Visit (INDEPENDENT_AMBULATORY_CARE_PROVIDER_SITE_OTHER): Payer: 59

## 2012-01-17 DIAGNOSIS — Z0000 Encounter for general adult medical examination without abnormal findings: Secondary | ICD-10-CM

## 2012-01-17 LAB — URINALYSIS, ROUTINE W REFLEX MICROSCOPIC
Nitrite: NEGATIVE
Total Protein, Urine: NEGATIVE
pH: 6.5 (ref 5.0–8.0)

## 2012-01-17 LAB — CBC WITH DIFFERENTIAL/PLATELET
Basophils Relative: 0.3 % (ref 0.0–3.0)
Eosinophils Relative: 3.6 % (ref 0.0–5.0)
Hemoglobin: 13.3 g/dL (ref 12.0–15.0)
Lymphocytes Relative: 33.8 % (ref 12.0–46.0)
MCHC: 33 g/dL (ref 30.0–36.0)
MCV: 87 fl (ref 78.0–100.0)
Monocytes Absolute: 0.5 10*3/uL (ref 0.1–1.0)
Neutro Abs: 2.3 10*3/uL (ref 1.4–7.7)
Neutrophils Relative %: 51.6 % (ref 43.0–77.0)
RBC: 4.62 Mil/uL (ref 3.87–5.11)
WBC: 4.6 10*3/uL (ref 4.5–10.5)

## 2012-01-17 LAB — COMPREHENSIVE METABOLIC PANEL
Albumin: 3.7 g/dL (ref 3.5–5.2)
BUN: 10 mg/dL (ref 6–23)
Calcium: 8.9 mg/dL (ref 8.4–10.5)
Chloride: 106 mEq/L (ref 96–112)
Creatinine, Ser: 0.6 mg/dL (ref 0.4–1.2)
GFR: 124.66 mL/min (ref >60.00)
Glucose, Bld: 78 mg/dL (ref 70–99)
Potassium: 4.6 mEq/L (ref 3.5–5.1)

## 2012-01-17 LAB — LIPID PANEL
Cholesterol: 187 mg/dL (ref 0–200)
HDL: 62.8 mg/dL (ref >39.00)
Triglycerides: 42 mg/dL (ref 0.0–149.0)

## 2012-01-20 ENCOUNTER — Encounter: Payer: Self-pay | Admitting: Internal Medicine

## 2012-01-20 ENCOUNTER — Ambulatory Visit (INDEPENDENT_AMBULATORY_CARE_PROVIDER_SITE_OTHER): Payer: 59 | Admitting: Internal Medicine

## 2012-01-20 VITALS — BP 148/80 | HR 80 | Temp 98.5°F | Resp 16 | Wt 190.0 lb

## 2012-01-20 DIAGNOSIS — R21 Rash and other nonspecific skin eruption: Secondary | ICD-10-CM

## 2012-01-20 DIAGNOSIS — E042 Nontoxic multinodular goiter: Secondary | ICD-10-CM

## 2012-01-20 DIAGNOSIS — E559 Vitamin D deficiency, unspecified: Secondary | ICD-10-CM

## 2012-01-20 DIAGNOSIS — Z1211 Encounter for screening for malignant neoplasm of colon: Secondary | ICD-10-CM

## 2012-01-20 DIAGNOSIS — Z23 Encounter for immunization: Secondary | ICD-10-CM

## 2012-01-20 DIAGNOSIS — I10 Essential (primary) hypertension: Secondary | ICD-10-CM

## 2012-01-20 DIAGNOSIS — F341 Dysthymic disorder: Secondary | ICD-10-CM

## 2012-01-20 DIAGNOSIS — E538 Deficiency of other specified B group vitamins: Secondary | ICD-10-CM

## 2012-01-20 DIAGNOSIS — Z0000 Encounter for general adult medical examination without abnormal findings: Secondary | ICD-10-CM | POA: Insufficient documentation

## 2012-01-20 NOTE — Assessment & Plan Note (Signed)
Continue with current prescription therapy as reflected on the Med list.  

## 2012-01-20 NOTE — Assessment & Plan Note (Signed)
Chronic Nl BP at home per pt Continue with current prescription therapy as reflected on the Med list.

## 2012-01-20 NOTE — Assessment & Plan Note (Signed)
F/u w/Endo this year

## 2012-01-20 NOTE — Assessment & Plan Note (Addendum)
10/13 We discussed age appropriate health related issues, including available/recomended screening tests and vaccinations. We discussed a need for adhering to healthy diet and exercise. Labs/EKG were reviewed/ordered. All questions were answered. Declined Zostavax Colon - Dr Leone Payor

## 2012-01-20 NOTE — Assessment & Plan Note (Signed)
Distal shins ? Thyroid meds related - Dr Emily Filbert, s/p bx 2012

## 2012-01-20 NOTE — Assessment & Plan Note (Signed)
Resolved

## 2012-01-20 NOTE — Progress Notes (Signed)
   Subjective:    Patient ID: Krystal Gibbs, female    DOB: 02-18-1961, 51 y.o.   MRN: [id number]  HPI  The patient is here for a wellness exam. The patient has been doing well overall without major physical or psychological issues going on lately. BP is nl at home The patient presents for a follow-up of  chronic hypertension, chronic dyslipidemia, hypothyroidism controlled with medicines. F/u on rash on B LEs - she had seen Derm - not better  BP Readings from Last 3 Encounters:  01/20/12 148/80  07/05/11 160/98  10/12/10 130/80   Wt Readings from Last 3 Encounters:  01/20/12 190 lb (86.183 kg)  07/05/11 185 lb (83.915 kg)  10/12/10 191 lb (86.637 kg)       Review of Systems  Constitutional: Negative for chills, activity change, appetite change, fatigue and unexpected weight change.  HENT: Negative for congestion, mouth sores and sinus pressure.   Eyes: Negative for visual disturbance.  Respiratory: Negative for cough and chest tightness.   Gastrointestinal: Negative for nausea and abdominal pain.  Genitourinary: Negative for frequency, difficulty urinating and vaginal pain.  Musculoskeletal: Negative for back pain and gait problem.  Skin: Positive for rash. Negative for pallor.  Neurological: Negative for dizziness, tremors, weakness, numbness and headaches.  Psychiatric/Behavioral: Negative for confusion and disturbed wake/sleep cycle.       Objective:   Physical Exam  Constitutional: She appears well-developed. No distress.       Obese   HENT:  Head: Normocephalic.  Right Ear: External ear normal.  Left Ear: External ear normal.  Nose: Nose normal.  Mouth/Throat: Oropharynx is clear and moist.  Eyes: Conjunctivae normal are normal. Pupils are equal, round, and reactive to light. Right eye exhibits no discharge. Left eye exhibits no discharge.  Neck: Normal range of motion. Neck supple. No JVD present. No tracheal deviation present. Thyromegaly (nodular goiter)  present.  Cardiovascular: Normal rate, regular rhythm and normal heart sounds.   Pulmonary/Chest: No stridor. No respiratory distress. She has no wheezes.  Abdominal: Soft. Bowel sounds are normal. She exhibits no distension and no mass. There is no tenderness. There is no rebound and no guarding.  Musculoskeletal: She exhibits no edema and no tenderness.  Lymphadenopathy:    She has no cervical adenopathy.  Neurological: She displays normal reflexes. No cranial nerve deficit. She exhibits normal muscle tone. Coordination normal.  Skin: Rash (B lat dist shins) noted. No erythema.  Psychiatric: She has a normal mood and affect. Her behavior is normal. Judgment and thought content normal.   Lab Results  Component Value Date   WBC 4.6 01/17/2012   HGB 13.3 01/17/2012   HCT 40.2 01/17/2012   PLT 311.0 01/17/2012   GLUCOSE 78 01/17/2012   CHOL 187 01/17/2012   TRIG 42.0 01/17/2012   HDL 62.80 01/17/2012   LDLDIRECT 98.4 06/02/2006   LDLCALC 116* 01/17/2012   ALT 12 01/17/2012   AST 17 01/17/2012   NA 140 01/17/2012   K 4.6 01/17/2012   CL 106 01/17/2012   CREATININE 0.6 01/17/2012   BUN 10 01/17/2012   CO2 28 01/17/2012   TSH 1.21 01/17/2012          Assessment & Plan:

## 2012-01-27 ENCOUNTER — Telehealth: Payer: Self-pay | Admitting: *Deleted

## 2012-01-27 NOTE — Telephone Encounter (Signed)
R'cd fax from [facility] to changed Synthroid to generic-please advise.

## 2012-01-27 NOTE — Telephone Encounter (Signed)
Ok Thx 

## 2012-01-28 MED ORDER — LEVOTHYROXINE SODIUM 50 MCG PO TABS: 50.0000 ug | ORAL_TABLET | Freq: Every day | ORAL | Status: DC

## 2012-01-28 NOTE — Telephone Encounter (Signed)
Rx for Levothyroxine sent to [facility].

## 2012-02-19 ENCOUNTER — Telehealth: Payer: Self-pay | Admitting: *Deleted

## 2012-02-19 NOTE — Telephone Encounter (Signed)
Pt informed per Dr, Plotnikov no treatment needed unless eye becomes painful or swollen.

## 2012-02-19 NOTE — Telephone Encounter (Signed)
Pt calling c/o red blood spot on Rt eye since this am. She denies any other sxs. She wants to if she should be concerned or needs treatment. Please advise

## 2012-03-16 ENCOUNTER — Telehealth: Payer: Self-pay | Admitting: *Deleted

## 2012-03-16 MED ORDER — DOXYCYCLINE HYCLATE 100 MG PO TABS: 100.0000 mg | ORAL_TABLET | Freq: Two times a day (BID) | ORAL | Status: DC

## 2012-03-16 NOTE — Telephone Encounter (Signed)
Pt is requesting Rx for Amoxicillin for tooth infection. She c/o pain and gum edema. Please advise.

## 2012-03-16 NOTE — Telephone Encounter (Signed)
Pt informed

## 2012-03-16 NOTE — Telephone Encounter (Signed)
Ok Doxy  OV if no better Thx

## 2012-04-30 ENCOUNTER — Other Ambulatory Visit: Payer: Self-pay | Admitting: Internal Medicine

## 2012-05-28 ENCOUNTER — Encounter: Payer: Self-pay | Admitting: Internal Medicine

## 2012-06-02 ENCOUNTER — Other Ambulatory Visit: Payer: Self-pay | Admitting: Internal Medicine

## 2012-07-24 ENCOUNTER — Ambulatory Visit: Payer: 59 | Admitting: Internal Medicine

## 2012-08-07 ENCOUNTER — Ambulatory Visit: Payer: 59 | Admitting: Internal Medicine

## 2012-08-10 ENCOUNTER — Other Ambulatory Visit: Payer: Self-pay | Admitting: Internal Medicine

## 2012-08-21 ENCOUNTER — Ambulatory Visit: Payer: 59 | Admitting: Internal Medicine

## 2012-09-01 ENCOUNTER — Telehealth: Payer: Self-pay | Admitting: *Deleted

## 2012-09-01 DIAGNOSIS — E559 Vitamin D deficiency, unspecified: Secondary | ICD-10-CM

## 2012-09-01 DIAGNOSIS — E538 Deficiency of other specified B group vitamins: Secondary | ICD-10-CM

## 2012-09-01 DIAGNOSIS — K769 Liver disease, unspecified: Secondary | ICD-10-CM

## 2012-09-01 DIAGNOSIS — E0590 Thyrotoxicosis, unspecified without thyrotoxic crisis or storm: Secondary | ICD-10-CM

## 2012-09-01 DIAGNOSIS — M545 Low back pain: Secondary | ICD-10-CM

## 2012-09-01 DIAGNOSIS — I10 Essential (primary) hypertension: Secondary | ICD-10-CM

## 2012-09-01 NOTE — Telephone Encounter (Signed)
Pt requests labs be drawn prior to her OV next week. She specifically requests TSH, BMET and lipids and any others you want. Please advise.

## 2012-09-02 NOTE — Telephone Encounter (Signed)
Vit B12, Vit D, TSH, BMET, CBC, LFT Thx

## 2012-09-03 ENCOUNTER — Other Ambulatory Visit (INDEPENDENT_AMBULATORY_CARE_PROVIDER_SITE_OTHER): Payer: 59

## 2012-09-03 DIAGNOSIS — E559 Vitamin D deficiency, unspecified: Secondary | ICD-10-CM

## 2012-09-03 DIAGNOSIS — E538 Deficiency of other specified B group vitamins: Secondary | ICD-10-CM

## 2012-09-03 DIAGNOSIS — M545 Low back pain: Secondary | ICD-10-CM

## 2012-09-03 DIAGNOSIS — E0590 Thyrotoxicosis, unspecified without thyrotoxic crisis or storm: Secondary | ICD-10-CM

## 2012-09-03 DIAGNOSIS — K769 Liver disease, unspecified: Secondary | ICD-10-CM

## 2012-09-03 DIAGNOSIS — I10 Essential (primary) hypertension: Secondary | ICD-10-CM

## 2012-09-03 LAB — URINALYSIS, ROUTINE W REFLEX MICROSCOPIC
Bilirubin Urine: NEGATIVE
Hgb urine dipstick: NEGATIVE
Ketones, ur: NEGATIVE
Leukocytes, UA: NEGATIVE
Nitrite: NEGATIVE
RBC / HPF: NONE SEEN
Specific Gravity, Urine: 1.01
Total Protein, Urine: NEGATIVE
Urine Glucose: NEGATIVE
Urobilinogen, UA: 0.2
WBC, UA: NONE SEEN
pH: 7 (ref 5.0–8.0)

## 2012-09-03 LAB — CBC WITH DIFFERENTIAL/PLATELET
Basophils Absolute: 0 K/uL (ref 0.0–0.1)
Basophils Relative: 0.6 % (ref 0.0–3.0)
Eosinophils Absolute: 0.1 K/uL (ref 0.0–0.7)
Eosinophils Relative: 2.3 % (ref 0.0–5.0)
HCT: 40.9 % (ref 36.0–46.0)
Hemoglobin: 13.6 g/dL (ref 12.0–15.0)
Lymphocytes Relative: 37.4 % (ref 12.0–46.0)
Lymphs Abs: 1.8 K/uL (ref 0.7–4.0)
MCHC: 33.3 g/dL (ref 30.0–36.0)
MCV: 87 fl (ref 78.0–100.0)
Monocytes Absolute: 0.4 K/uL (ref 0.1–1.0)
Monocytes Relative: 8.7 % (ref 3.0–12.0)
Neutro Abs: 2.5 K/uL (ref 1.4–7.7)
Neutrophils Relative %: 51 % (ref 43.0–77.0)
Platelets: 277 K/uL (ref 150.0–400.0)
RBC: 4.7 Mil/uL (ref 3.87–5.11)
RDW: 13.8 % (ref 11.5–14.6)
WBC: 4.9 K/uL (ref 4.5–10.5)

## 2012-09-03 LAB — HEPATIC FUNCTION PANEL
ALT: 13 U/L (ref 0–35)
AST: 17 U/L (ref 0–37)
Albumin: 4 g/dL (ref 3.5–5.2)
Alkaline Phosphatase: 72 U/L (ref 39–117)

## 2012-09-03 LAB — BASIC METABOLIC PANEL
BUN: 11 mg/dL (ref 6–23)
CO2: 25 mEq/L (ref 19–32)
Chloride: 109 mEq/L (ref 96–112)
Creatinine, Ser: 0.7 mg/dL (ref 0.4–1.2)
Glucose, Bld: 81 mg/dL (ref 70–99)
Potassium: 4 mEq/L (ref 3.5–5.1)

## 2012-09-03 LAB — TSH: TSH: 1.3 u[IU]/mL (ref 0.35–5.50)

## 2012-09-03 LAB — VITAMIN B12: Vitamin B-12: 304 pg/mL (ref 211–911)

## 2012-09-03 NOTE — Telephone Encounter (Signed)
Labs ordered. Pt informed

## 2012-09-04 LAB — VITAMIN D 25 HYDROXY (VIT D DEFICIENCY, FRACTURES): Vit D, 25-Hydroxy: 35 ng/mL (ref 30–89)

## 2012-09-07 ENCOUNTER — Encounter: Payer: Self-pay | Admitting: Internal Medicine

## 2012-09-07 ENCOUNTER — Ambulatory Visit (INDEPENDENT_AMBULATORY_CARE_PROVIDER_SITE_OTHER): Payer: 59 | Admitting: Internal Medicine

## 2012-09-07 ENCOUNTER — Telehealth: Payer: Self-pay | Admitting: Endocrinology

## 2012-09-07 VITALS — BP 140/80 | HR 80 | Temp 97.9°F | Resp 16 | Wt 191.0 lb

## 2012-09-07 DIAGNOSIS — E0590 Thyrotoxicosis, unspecified without thyrotoxic crisis or storm: Secondary | ICD-10-CM

## 2012-09-07 DIAGNOSIS — M545 Low back pain: Secondary | ICD-10-CM

## 2012-09-07 DIAGNOSIS — G4700 Insomnia, unspecified: Secondary | ICD-10-CM

## 2012-09-07 DIAGNOSIS — E039 Hypothyroidism, unspecified: Secondary | ICD-10-CM

## 2012-09-07 DIAGNOSIS — I10 Essential (primary) hypertension: Secondary | ICD-10-CM

## 2012-09-07 DIAGNOSIS — F341 Dysthymic disorder: Secondary | ICD-10-CM

## 2012-09-07 DIAGNOSIS — E042 Nontoxic multinodular goiter: Secondary | ICD-10-CM

## 2012-09-07 NOTE — Assessment & Plan Note (Signed)
BP Readings from Last 3 Encounters:  09/07/12 140/80  01/20/12 148/80  07/05/11 160/98

## 2012-09-07 NOTE — Telephone Encounter (Signed)
ok 

## 2012-09-07 NOTE — Assessment & Plan Note (Signed)
Resolved

## 2012-09-07 NOTE — Progress Notes (Signed)
   Subjective:    HPI  The patient has been doing well overall without major physical or psychological issues going on lately. BP is nl at home The patient presents for a follow-up of  chronic hypertension, chronic dyslipidemia, hypothyroidism controlled with medicines. F/u on rash on B LEs - she had seen Derm - not better  BP Readings from Last 3 Encounters:  09/07/12 154/100  01/20/12 148/80  07/05/11 160/98   Wt Readings from Last 3 Encounters:  09/07/12 191 lb (86.637 kg)  01/20/12 190 lb (86.183 kg)  07/05/11 185 lb (83.915 kg)       Review of Systems  Constitutional: Negative for chills, activity change, appetite change, fatigue and unexpected weight change.  HENT: Negative for congestion, mouth sores and sinus pressure.   Eyes: Negative for visual disturbance.  Respiratory: Negative for cough and chest tightness.   Gastrointestinal: Negative for nausea and abdominal pain.  Genitourinary: Negative for frequency, difficulty urinating and vaginal pain.  Musculoskeletal: Negative for back pain and gait problem.  Skin: Positive for rash. Negative for pallor.  Neurological: Negative for dizziness, tremors, weakness, numbness and headaches.  Psychiatric/Behavioral: Negative for confusion and sleep disturbance.       Objective:   Physical Exam  Constitutional: She appears well-developed. No distress.  Obese   HENT:  Head: Normocephalic.  Right Ear: External ear normal.  Left Ear: External ear normal.  Nose: Nose normal.  Mouth/Throat: Oropharynx is clear and moist.  Eyes: Conjunctivae are normal. Pupils are equal, round, and reactive to light. Right eye exhibits no discharge. Left eye exhibits no discharge.  Neck: Normal range of motion. Neck supple. No JVD present. No tracheal deviation present. Thyromegaly (nodular goiter) present.  Cardiovascular: Normal rate, regular rhythm and normal heart sounds.   Pulmonary/Chest: No stridor. No respiratory distress. She has  no wheezes.  Abdominal: Soft. Bowel sounds are normal. She exhibits no distension and no mass. There is no tenderness. There is no rebound and no guarding.  Musculoskeletal: She exhibits no edema and no tenderness.  Lymphadenopathy:    She has no cervical adenopathy.  Neurological: She displays normal reflexes. No cranial nerve deficit. She exhibits normal muscle tone. Coordination normal.  Skin: Rash (B lat dist shins) noted. No erythema.  Psychiatric: She has a normal mood and affect. Her behavior is normal. Judgment and thought content normal.   Lab Results  Component Value Date   WBC 4.9 09/03/2012   HGB 13.6 09/03/2012   HCT 40.9 09/03/2012   PLT 277.0 09/03/2012   GLUCOSE 81 09/03/2012   CHOL 187 01/17/2012   TRIG 42.0 01/17/2012   HDL 62.80 01/17/2012   LDLDIRECT 98.4 06/02/2006   LDLCALC 116* 01/17/2012   ALT 13 09/03/2012   AST 17 09/03/2012   NA 142 09/03/2012   K 4.0 09/03/2012   CL 109 09/03/2012   CREATININE 0.7 09/03/2012   BUN 11 09/03/2012   CO2 25 09/03/2012   TSH 1.30 09/03/2012          Assessment & Plan:

## 2012-09-07 NOTE — Assessment & Plan Note (Signed)
Chronic  Hot flashes

## 2012-09-07 NOTE — Telephone Encounter (Signed)
Pt needs an appt for an ov

## 2012-09-07 NOTE — Assessment & Plan Note (Signed)
Thyroid US Dr Elvera Lennox

## 2012-09-07 NOTE — Patient Instructions (Signed)
Try gluten free diet 

## 2012-09-07 NOTE — Assessment & Plan Note (Signed)
Continue with current prescription therapy as reflected on the Med list.  

## 2012-09-08 ENCOUNTER — Telehealth: Payer: Self-pay | Admitting: Endocrinology

## 2012-09-14 ENCOUNTER — Other Ambulatory Visit: Payer: 59

## 2012-09-24 ENCOUNTER — Telehealth: Payer: Self-pay | Admitting: *Deleted

## 2012-09-24 DIAGNOSIS — Z0000 Encounter for general adult medical examination without abnormal findings: Secondary | ICD-10-CM

## 2012-09-24 NOTE — Telephone Encounter (Signed)
Message copied by Merrilyn Puma on Thu Sep 24, 2012  9:11 AM ------      Message from: Etheleen Sia      Created: Mon Sep 07, 2012  9:14 AM      Regarding: LABS       DEC PHYSICAL LABS ------

## 2012-09-24 NOTE — Telephone Encounter (Signed)
CPE labs entered.  

## 2012-10-05 ENCOUNTER — Ambulatory Visit: Payer: 59 | Admitting: Internal Medicine

## 2012-10-07 ENCOUNTER — Other Ambulatory Visit: Payer: Self-pay | Admitting: Internal Medicine

## 2012-11-08 ENCOUNTER — Other Ambulatory Visit: Payer: Self-pay | Admitting: Internal Medicine

## 2012-11-09 NOTE — Telephone Encounter (Signed)
Refill done.  

## 2013-01-28 ENCOUNTER — Other Ambulatory Visit: Payer: Self-pay

## 2013-02-24 ENCOUNTER — Encounter: Payer: 59 | Admitting: Internal Medicine

## 2013-03-03 ENCOUNTER — Telehealth: Payer: Self-pay | Admitting: *Deleted

## 2013-03-03 DIAGNOSIS — Z0000 Encounter for general adult medical examination without abnormal findings: Secondary | ICD-10-CM

## 2013-03-03 NOTE — Telephone Encounter (Signed)
Pt wants labs prior to her OV later this month. Please advise.

## 2013-03-04 ENCOUNTER — Encounter: Payer: 59 | Admitting: Internal Medicine

## 2013-03-04 NOTE — Telephone Encounter (Signed)
I see labs were entered on 09/24/12 Thx

## 2013-03-08 NOTE — Telephone Encounter (Signed)
TSH, CBC and Lipid have expired. I will reorder. Pt informed

## 2013-03-10 ENCOUNTER — Encounter: Payer: 59 | Admitting: Internal Medicine

## 2013-03-12 ENCOUNTER — Encounter: Payer: 59 | Admitting: Internal Medicine

## 2013-03-19 ENCOUNTER — Other Ambulatory Visit: Payer: Self-pay | Admitting: Internal Medicine

## 2013-04-16 ENCOUNTER — Encounter: Payer: 59 | Admitting: Internal Medicine

## 2013-04-19 ENCOUNTER — Encounter: Payer: Self-pay | Admitting: Internal Medicine

## 2013-04-19 ENCOUNTER — Other Ambulatory Visit: Payer: Self-pay | Admitting: Internal Medicine

## 2013-04-19 MED ORDER — LEVOTHYROXINE SODIUM 50 MCG PO TABS: 50.0000 ug | ORAL_TABLET | Freq: Every day | ORAL | Status: DC

## 2013-04-28 ENCOUNTER — Other Ambulatory Visit: Payer: Self-pay | Admitting: *Deleted

## 2013-04-28 ENCOUNTER — Ambulatory Visit (INDEPENDENT_AMBULATORY_CARE_PROVIDER_SITE_OTHER): Payer: 59 | Admitting: *Deleted

## 2013-04-28 DIAGNOSIS — I10 Essential (primary) hypertension: Secondary | ICD-10-CM

## 2013-04-28 DIAGNOSIS — E0590 Thyrotoxicosis, unspecified without thyrotoxic crisis or storm: Secondary | ICD-10-CM

## 2013-04-28 DIAGNOSIS — Z0000 Encounter for general adult medical examination without abnormal findings: Secondary | ICD-10-CM

## 2013-04-28 LAB — URINALYSIS, ROUTINE W REFLEX MICROSCOPIC
Bilirubin Urine: NEGATIVE
Ketones, ur: NEGATIVE
Leukocytes, UA: NEGATIVE
Nitrite: NEGATIVE
RBC / HPF: NONE SEEN
Specific Gravity, Urine: <=1.005 — AB
Total Protein, Urine: NEGATIVE
Urine Glucose: NEGATIVE
Urobilinogen, UA: 0.2
WBC, UA: NONE SEEN
pH: 6 (ref 5.0–8.0)

## 2013-04-28 LAB — CBC WITH DIFFERENTIAL/PLATELET
BASOS PCT: 0.3 % (ref 0.0–3.0)
Basophils Absolute: 0 10*3/uL (ref 0.0–0.1)
EOS ABS: 0.2 10*3/uL (ref 0.0–0.7)
Eosinophils Relative: 3.5 % (ref 0.0–5.0)
HCT: 41.8 % (ref 36.0–46.0)
HEMOGLOBIN: 13.5 g/dL (ref 12.0–15.0)
LYMPHS PCT: 34.7 % (ref 12.0–46.0)
Lymphs Abs: 1.8 10*3/uL (ref 0.7–4.0)
MCHC: 32.4 g/dL (ref 30.0–36.0)
MCV: 87.4 fl (ref 78.0–100.0)
MONOS PCT: 8.7 % (ref 3.0–12.0)
Monocytes Absolute: 0.5 10*3/uL (ref 0.1–1.0)
Neutro Abs: 2.8 10*3/uL (ref 1.4–7.7)
Neutrophils Relative %: 52.8 % (ref 43.0–77.0)
Platelets: 324 10*3/uL (ref 150.0–400.0)
RBC: 4.78 Mil/uL (ref 3.87–5.11)
RDW: 13.8 % (ref 11.5–14.6)
WBC: 5.3 10*3/uL (ref 4.5–10.5)

## 2013-04-28 LAB — COMPREHENSIVE METABOLIC PANEL
ALK PHOS: 71 U/L (ref 39–117)
ALT: 13 U/L (ref 0–35)
AST: 18 U/L (ref 0–37)
Albumin: 4.1 g/dL (ref 3.5–5.2)
BILIRUBIN TOTAL: 0.9 mg/dL (ref 0.3–1.2)
BUN: 11 mg/dL (ref 6–23)
CO2: 26 mEq/L (ref 19–32)
Calcium: 8.8 mg/dL (ref 8.4–10.5)
Chloride: 104 mEq/L (ref 96–112)
Creatinine, Ser: 0.6 mg/dL (ref 0.4–1.2)
GFR: 138.99 mL/min (ref >60.00)
Glucose, Bld: 76 mg/dL (ref 70–99)
Potassium: 3.6 mEq/L (ref 3.5–5.1)
SODIUM: 140 meq/L (ref 135–145)
TOTAL PROTEIN: 7.4 g/dL (ref 6.0–8.3)

## 2013-04-28 LAB — LIPID PANEL
CHOLESTEROL: 214 mg/dL — AB (ref 0–200)
HDL: 68 mg/dL (ref >39.00)
Total CHOL/HDL Ratio: 3
Triglycerides: 40 mg/dL (ref 0.0–149.0)
VLDL: 8 mg/dL (ref 0.0–40.0)

## 2013-04-28 LAB — T4, FREE: Free T4: 1.25 ng/dL (ref 0.60–1.60)

## 2013-04-28 LAB — T3, FREE: T3, Free: 2.6 pg/mL (ref 2.3–4.2)

## 2013-04-28 LAB — TSH: TSH: 3.91 u[IU]/mL (ref 0.35–5.50)

## 2013-04-29 LAB — LDL CHOLESTEROL, DIRECT: Direct LDL: 133.5 mg/dL

## 2013-04-30 ENCOUNTER — Encounter: Payer: Self-pay | Admitting: Internal Medicine

## 2013-04-30 ENCOUNTER — Ambulatory Visit (INDEPENDENT_AMBULATORY_CARE_PROVIDER_SITE_OTHER): Payer: 59 | Admitting: Internal Medicine

## 2013-04-30 VITALS — BP 150/108 | HR 76 | Temp 97.2°F | Resp 16 | Ht 63.0 in | Wt 193.0 lb

## 2013-04-30 DIAGNOSIS — F341 Dysthymic disorder: Secondary | ICD-10-CM

## 2013-04-30 DIAGNOSIS — Z0000 Encounter for general adult medical examination without abnormal findings: Secondary | ICD-10-CM

## 2013-04-30 DIAGNOSIS — E039 Hypothyroidism, unspecified: Secondary | ICD-10-CM

## 2013-04-30 DIAGNOSIS — I10 Essential (primary) hypertension: Secondary | ICD-10-CM

## 2013-04-30 MED ORDER — LOSARTAN POTASSIUM 50 MG PO TABS: ORAL_TABLET | ORAL | Status: DC

## 2013-04-30 MED ORDER — LOSARTAN POTASSIUM 50 MG PO TABS: 50.0000 mg | ORAL_TABLET | Freq: Every day | ORAL | Status: DC

## 2013-04-30 NOTE — Assessment & Plan Note (Signed)
Resolved

## 2013-04-30 NOTE — Patient Instructions (Signed)
Gluten free trial (no wheat products) for 4-6 weeks. OK to use gluten-free bread and gluten-free pasta.  Milk free trial (no milk, ice cream, cheese and yogurt) for 4-6 weeks. OK to use almond, coconut, rice or soy milk. "Almond breeze" brand tastes good.  

## 2013-04-30 NOTE — Assessment & Plan Note (Signed)
Continue with current prescription therapy as reflected on the Med list.  

## 2013-04-30 NOTE — Progress Notes (Signed)
Pre visit review using our clinic review tool, if applicable. No additional management support is needed unless otherwise documented below in the visit note. 

## 2013-04-30 NOTE — Progress Notes (Signed)
   Subjective:    HPI  The patient is here for a wellness exam. The patient has been doing well overall without major physical or psychological issues going on lately.   The patient presents for a follow-up of  chronic hypertension, chronic dyslipidemia, hypothyroidism controlled with medicines. F/u on rash on B LEs - she had seen Derm - not better  BP Readings from Last 3 Encounters:  04/30/13 150/108  09/07/12 140/80  01/20/12 148/80   Wt Readings from Last 3 Encounters:  04/30/13 193 lb (87.544 kg)  09/07/12 191 lb (86.637 kg)  01/20/12 190 lb (86.183 kg)       Review of Systems  Constitutional: Negative for chills, activity change, appetite change, fatigue and unexpected weight change.  HENT: Negative for congestion, mouth sores and sinus pressure.   Eyes: Negative for visual disturbance.  Respiratory: Negative for cough and chest tightness.   Gastrointestinal: Negative for nausea and abdominal pain.  Genitourinary: Negative for frequency, difficulty urinating and vaginal pain.  Musculoskeletal: Negative for back pain and gait problem.  Skin: Positive for rash. Negative for pallor.  Neurological: Negative for dizziness, tremors, weakness, numbness and headaches.  Psychiatric/Behavioral: Negative for confusion and sleep disturbance.       Objective:   Physical Exam  Constitutional: She appears well-developed. No distress.  Obese   HENT:  Head: Normocephalic.  Right Ear: External ear normal.  Left Ear: External ear normal.  Nose: Nose normal.  Mouth/Throat: Oropharynx is clear and moist.  Eyes: Conjunctivae are normal. Pupils are equal, round, and reactive to light. Right eye exhibits no discharge. Left eye exhibits no discharge.  Neck: Normal range of motion. Neck supple. No JVD present. No tracheal deviation present. Thyromegaly (nodular goiter) present.  Cardiovascular: Normal rate, regular rhythm and normal heart sounds.   Pulmonary/Chest: No stridor. No  respiratory distress. She has no wheezes.  Abdominal: Soft. Bowel sounds are normal. She exhibits no distension and no mass. There is no tenderness. There is no rebound and no guarding.  Musculoskeletal: She exhibits no edema and no tenderness.  Lymphadenopathy:    She has no cervical adenopathy.  Neurological: She displays normal reflexes. No cranial nerve deficit. She exhibits normal muscle tone. Coordination normal.  Skin: Rash (B lat dist shins) noted. No erythema.  Psychiatric: She has a normal mood and affect. Her behavior is normal. Judgment and thought content normal.   Lab Results  Component Value Date   WBC 5.3 04/28/2013   HGB 13.5 04/28/2013   HCT 41.8 04/28/2013   PLT 324.0 04/28/2013   GLUCOSE 76 04/28/2013   CHOL 214* 04/28/2013   TRIG 40.0 04/28/2013   HDL 68.00 04/28/2013   LDLDIRECT 133.5 04/28/2013   LDLCALC 116* 01/17/2012   ALT 13 04/28/2013   AST 18 04/28/2013   NA 140 04/28/2013   K 3.6 04/28/2013   CL 104 04/28/2013   CREATININE 0.6 04/28/2013   BUN 11 04/28/2013   CO2 26 04/28/2013   TSH 3.91 04/28/2013          Assessment & Plan:

## 2013-04-30 NOTE — Assessment & Plan Note (Addendum)
BP Readings from Last 3 Encounters:  04/30/13 150/108  09/07/12 140/80  01/20/12 148/80  Chronic Nl BP at home per pt Not taking Rx qd Risks associated with treatment noncompliance were discussed. Compliance was encouraged. Try Losartan 1/2 a day if issues

## 2013-04-30 NOTE — Assessment & Plan Note (Addendum)
We discussed age appropriate health related issues, including available/recomended screening tests and vaccinations. We discussed a need for adhering to healthy diet and exercise. Labs/EKG were reviewed/ordered. All questions were answered. She will sch colonoscopy herself Zostavax adviced

## 2013-06-23 ENCOUNTER — Encounter: Payer: Self-pay | Admitting: Internal Medicine

## 2013-06-23 ENCOUNTER — Ambulatory Visit (INDEPENDENT_AMBULATORY_CARE_PROVIDER_SITE_OTHER): Payer: 59 | Admitting: Internal Medicine

## 2013-06-23 VITALS — BP 140/90 | HR 72 | Temp 97.7°F | Resp 16 | Wt 192.0 lb

## 2013-06-23 DIAGNOSIS — J209 Acute bronchitis, unspecified: Secondary | ICD-10-CM | POA: Insufficient documentation

## 2013-06-23 MED ORDER — PROMETHAZINE-CODEINE 6.25-10 MG/5ML PO SYRP: 5.0000 mL | ORAL_SOLUTION | ORAL | Status: DC | PRN

## 2013-06-23 MED ORDER — AZITHROMYCIN 250 MG PO TABS: ORAL_TABLET | ORAL | Status: DC

## 2013-06-23 NOTE — Assessment & Plan Note (Signed)
Zpac Prom-cod syr 

## 2013-06-23 NOTE — Progress Notes (Signed)
   Subjective:    URI  This is a new problem. The current episode started 1 to 4 weeks ago (2 wks). The problem has been unchanged. Associated symptoms include congestion, coughing, a rash, rhinorrhea and sinus pain. Pertinent negatives include no abdominal pain, chest pain, headaches or nausea. She has tried acetaminophen and decongestant for the symptoms. The treatment provided no relief.      The patient presents for a follow-up of  chronic hypertension, chronic dyslipidemia, hypothyroidism controlled with medicines. F/u on rash on B LEs - she had seen Derm - not better  BP Readings from Last 3 Encounters:  06/23/13 140/90  04/30/13 150/108  09/07/12 140/80   Wt Readings from Last 3 Encounters:  06/23/13 192 lb (87.091 kg)  04/30/13 193 lb (87.544 kg)  09/07/12 191 lb (86.637 kg)       Review of Systems  Constitutional: Negative for chills, activity change, appetite change, fatigue and unexpected weight change.  HENT: Positive for congestion and rhinorrhea. Negative for mouth sores and sinus pressure.   Eyes: Negative for visual disturbance.  Respiratory: Positive for cough. Negative for chest tightness.   Cardiovascular: Negative for chest pain.  Gastrointestinal: Negative for nausea and abdominal pain.  Genitourinary: Negative for frequency, difficulty urinating and vaginal pain.  Musculoskeletal: Negative for back pain and gait problem.  Skin: Positive for rash. Negative for pallor.  Neurological: Negative for dizziness, tremors, weakness, numbness and headaches.  Psychiatric/Behavioral: Negative for confusion and sleep disturbance.       Objective:   Physical Exam  Constitutional: She appears well-developed. No distress.  Obese   HENT:  Head: Normocephalic.  Right Ear: External ear normal.  Left Ear: External ear normal.  Nose: Nose normal.  Mouth/Throat: Oropharynx is clear and moist.  Eyes: Conjunctivae are normal. Pupils are equal, round, and reactive to  light. Right eye exhibits no discharge. Left eye exhibits no discharge.  Neck: Normal range of motion. Neck supple. No JVD present. No tracheal deviation present. Thyromegaly (nodular goiter) present.  Cardiovascular: Normal rate, regular rhythm and normal heart sounds.   Pulmonary/Chest: No stridor. No respiratory distress. She has no wheezes.  Abdominal: Soft. Bowel sounds are normal. She exhibits no distension and no mass. There is no tenderness. There is no rebound and no guarding.  Musculoskeletal: She exhibits no edema and no tenderness.  Lymphadenopathy:    She has no cervical adenopathy.  Neurological: She displays normal reflexes. No cranial nerve deficit. She exhibits normal muscle tone. Coordination normal.  Skin: Rash (B lat dist shins) noted. No erythema.  Psychiatric: She has a normal mood and affect. Her behavior is normal. Judgment and thought content normal.  eryth throat  Lab Results  Component Value Date   WBC 5.3 04/28/2013   HGB 13.5 04/28/2013   HCT 41.8 04/28/2013   PLT 324.0 04/28/2013   GLUCOSE 76 04/28/2013   CHOL 214* 04/28/2013   TRIG 40.0 04/28/2013   HDL 68.00 04/28/2013   LDLDIRECT 133.5 04/28/2013   LDLCALC 116* 01/17/2012   ALT 13 04/28/2013   AST 18 04/28/2013   NA 140 04/28/2013   K 3.6 04/28/2013   CL 104 04/28/2013   CREATININE 0.6 04/28/2013   BUN 11 04/28/2013   CO2 26 04/28/2013   TSH 3.91 04/28/2013          Assessment & Plan:

## 2013-06-23 NOTE — Progress Notes (Signed)
Pre visit review using our clinic review tool, if applicable. No additional management support is needed unless otherwise documented below in the visit note. 

## 2013-06-29 ENCOUNTER — Encounter: Payer: Self-pay | Admitting: Certified Nurse Midwife

## 2013-06-30 ENCOUNTER — Encounter: Payer: Self-pay | Admitting: Certified Nurse Midwife

## 2013-06-30 ENCOUNTER — Ambulatory Visit (INDEPENDENT_AMBULATORY_CARE_PROVIDER_SITE_OTHER): Payer: 59 | Admitting: Certified Nurse Midwife

## 2013-06-30 VITALS — BP 130/86 | HR 76 | Ht 62.5 in | Wt 191.0 lb

## 2013-06-30 DIAGNOSIS — Z01419 Encounter for gynecological examination (general) (routine) without abnormal findings: Secondary | ICD-10-CM

## 2013-06-30 DIAGNOSIS — Z0000 Encounter for general adult medical examination without abnormal findings: Secondary | ICD-10-CM

## 2013-06-30 NOTE — Patient Instructions (Signed)

## 2013-06-30 NOTE — Progress Notes (Signed)
Reviewed personally.  M. Suzanne Billy Turvey, MD.  

## 2013-06-30 NOTE — Progress Notes (Signed)
Patient ID: Krystal Gibbs, female   DOB: [birth year], 53 y.o.   MRN: [id number] 53 y.o. G1P1001 Married African American Fe here for annual exam. Menopausal with no HRT. Denies vaginal bleeding. Occasional vaginal dryness, with good result with [place] moisture use. Sees PCP for hyothyroid/cholesterol and hypertension/Vit D management. No medication change in past year with PCP, who does aex and labs. Continues with soreness of/on under right arm with exercise, but no change. Wears good support bra when running. No health issues today.   No LMP recorded. Patient has had a hysterectomy.          Sexually active: yes  The current method of family planning is status post hysterectomy.    Exercising: yes  Home exercise routine includes running 5 days per week.  Zumba twice weekly with [facility].. Smoker:  no  Health Maintenance: Pap:  03/22/10, WNL MMG:  07/24/12, Bi-Rads 1: negative Colonoscopy:  never BMD:   04/2010, -0.3/-0.6 TDaP:  09/23/10 Labs: PCP in EPIC in 04/2013   reports that she has never smoked. She does not have any smokeless tobacco history on file. She reports that she does not drink alcohol or use illicit drugs.  Past Medical History  Diagnosis Date  . Low back pain   . Leg pain   . Accidental fall     on or from other stairs or steps  . Contusion of face, scalp, and neck except eye(s)   . Unspecified pruritic disorder   . Allergic rhinitis, cause unspecified   . Rash and other nonspecific skin eruption   . Unspecified vitamin D deficiency   . Pain in limb   . Chest pain, unspecified   . Cervicalgia   . Hypertension 2011  . Fibroid   . 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 Hypothyroidism 2011    Past Surgical History  Procedure Laterality Date  . Abdominal hysterectomy  1999    TAH, ovaries retained    Current Outpatient Prescriptions  Medication Sig Dispense Refill  . azithromycin (ZITHROMAX) 250 MG tablet As directed  6  tablet  0  . BIOTIN PO Take by mouth daily.      . Cholecalciferol (VITAMIN D PO) Take by mouth daily.      . Cholecalciferol 5000 UNITS capsule Take 5,000 Units by mouth daily.      Marland Kitchen conjugated estrogens (PREMARIN) vaginal cream Place 1 Applicatorful vaginally 2 (two) times a week.      . Cyanocobalamin (VITAMIN B-12) 500 MCG SUBL Place 1 tablet (500 mcg total) under the tongue 1 day or 1 dose.  100 tablet  3  . levothyroxine (SYNTHROID, LEVOTHROID) 50 MCG tablet Take 1 tablet (50 mcg total) by mouth daily before breakfast.  30 tablet  11  . loratadine (CLARITIN) 10 MG tablet Take 10 mg by mouth daily.        Marland Kitchen losartan (COZAAR) 50 MG tablet Take 1 tablet (50 mg total) by mouth daily.  90 tablet  3  . Multiple Vitamins-Minerals (MULTIVITAMIN PO) Take by mouth daily.      . promethazine-codeine (PHENERGAN WITH CODEINE) 6.25-10 MG/5ML syrup Take 5 mLs by mouth every 4 (four) hours as needed for cough.  300 mL  0  . triamcinolone (KENALOG) 0.5 % cream Apply topically 2 (two) times daily.         No current facility-administered medications for this visit.    Family History  Problem Relation Age of Onset  .  Coronary artery disease Neg Hx   . Thyroid disease Neg Hx   . Early death Mother   . Diabetes Sister   . Diabetes Paternal Uncle     ROS:  Pertinent items are noted in HPI.  Otherwise, a comprehensive ROS was negative.  Exam:   BP 130/86  Pulse 76  Ht 5' 2.5" (1.588 m)  Wt 191 lb (86.637 kg)  BMI 34.36 kg/m2 Height: 5' 2.5" (158.8 cm)  Ht Readings from Last 3 Encounters:  06/30/13 5' 2.5" (1.588 m)  04/30/13 5\' 3"  (1.6 m)  10/12/10 5\' 3"  (1.6 m)    General appearance: alert, cooperative and appears stated age Head: Normocephalic, without obvious abnormality, atraumatic Neck: no adenopathy, supple, symmetrical, trachea midline and thyroid normal to inspection and palpation Lungs: clear to auscultation bilaterally Breasts: normal appearance, no masses or tenderness, No  nipple retraction or dimpling, No nipple discharge or bleeding, No axillary or supraclavicular adenopathy or tenderness with exam Heart: regular rate and rhythm Abdomen: soft, non-tender; no masses,  no organomegaly Extremities: extremities normal, atraumatic, no cyanosis or edema Skin: Skin color, texture, turgor normal. No rashes or lesions Lymph nodes: Cervical, supraclavicular, and axillary nodes normal. No abnormal inguinal nodes palpated Neurologic: Grossly normal   Pelvic: External genitalia:  no lesions              Urethra:  normal appearing urethra with no masses, tenderness or lesions              Bartholin's and Skene's: normal                 Vagina: normal appearing vagina with normal color and discharge, no lesions              Cervix: absent              Pap taken: no Bimanual Exam:  Uterus:  uterus absent              Adnexa: normal adnexa and no mass, fullness, tenderness               Rectovaginal: Confirms               Anus:  normal sphincter tone, no lesions  A:  Well Woman with normal exam  Menopausal no HRT s/p TAH due to fibroid, ovaries retained   Hypertension/cholestereol/hypothyroid stable medication with PCP management   P:   Reviewed health and wellness pertinent to exam  Continue follow up as indicated  Pap smear as per guidelines   Mammogram yearly pap smear not taken today  counseled on breast self exam, mammography screening, adequate intake of calcium and vitamin D, diet and exercise and weight control.  return annually or prn  An After Visit Summary was printed and given to the patient.

## 2013-07-01 ENCOUNTER — Telehealth: Payer: Self-pay | Admitting: Certified Nurse Midwife

## 2013-07-01 NOTE — Telephone Encounter (Signed)
Left message to call Kaitlyn at [phone]. 

## 2013-07-01 NOTE — Telephone Encounter (Signed)
Patient called during lunch said she was returning someones call from yesterday but i dont see anything.

## 2013-07-02 ENCOUNTER — Ambulatory Visit: Payer: Self-pay | Admitting: Certified Nurse Midwife

## 2013-07-02 NOTE — Telephone Encounter (Signed)
Left message to call Krystal Gibbs at [phone]. 

## 2013-07-02 NOTE — Telephone Encounter (Signed)
Spoke with patient. Patient states that she received a phone call on 4/8 after coming in for AEX with Regina Eck CNM but is not sure who called. No telephone encounters open from 4/8 for this patient and I do not see that she has any labs available at this time that she would need results from. Advised would check with Regina Eck CNM and CMA to see if they may have called to inform her of any information and would give her a call back.   Regina Eck CNM/ Joy, is there anything that you would like me to notify this patient of at this time?

## 2013-07-02 NOTE — Telephone Encounter (Signed)
Left message on patients mobile number [phone] per patient and DPR form. Advised patient we checked around and at this time no one was trying to reach patient from our office. Advised may have been an old message or a different practice trying to reach her. Instructed if any further questions or concerns to call back Monday morning at 8:30.  Routing to provider for final review. Patient agreeable to disposition. Will close encounter

## 2013-07-02 NOTE — Telephone Encounter (Signed)
Patient is returning a call to [place]. Patient says you may leave a message at this number.

## 2013-07-02 NOTE — Telephone Encounter (Signed)
I do not see any reason we would have called. No labs drawn at visit or pap smear. Maybe another office

## 2013-07-02 NOTE — Telephone Encounter (Signed)
I was not in the office but I checked with the other cmas & no one said they had called her. Krystal Gibbs can look at to see if she called her

## 2013-08-09 ENCOUNTER — Ambulatory Visit: Payer: Self-pay | Admitting: Certified Nurse Midwife

## 2013-10-25 ENCOUNTER — Other Ambulatory Visit: Payer: Self-pay | Admitting: Internal Medicine

## 2013-10-29 ENCOUNTER — Ambulatory Visit: Payer: 59 | Admitting: Internal Medicine

## 2013-12-06 ENCOUNTER — Telehealth: Payer: Self-pay | Admitting: *Deleted

## 2013-12-06 DIAGNOSIS — E039 Hypothyroidism, unspecified: Secondary | ICD-10-CM

## 2013-12-06 DIAGNOSIS — Z0000 Encounter for general adult medical examination without abnormal findings: Secondary | ICD-10-CM

## 2013-12-06 NOTE — Telephone Encounter (Signed)
Message copied by Earnstine Regal on Mon Dec 06, 2013  2:47 PM ------      Message from: [place], Krystal Gibbs B      Created: Mon Dec 06, 2013 10:18 AM      Regarding: LAB ORDER      Contact: [phone]       Delrae Sawyers, would you ask Dr. Alain Marion to place a future order for labs for me,  I see him on Friday 12/10/13 and I need to have labs done before that.  I get them done here at [facility] where I work so that's why it needs to be a future order for Lanny Hurst to pull it.              I need a CMP, TSH, T3, T4,  LFT's, UA, and just a full panel to check everything.               Thank you,   Krystal Gibbs ------

## 2013-12-06 NOTE — Telephone Encounter (Signed)
Ok lipids, CMP, TSH, T3, T4, LFT's, UA, Thx!

## 2013-12-07 ENCOUNTER — Encounter: Payer: Self-pay | Admitting: *Deleted

## 2013-12-07 NOTE — Telephone Encounter (Signed)
Notified pt labs entered.../lmb 

## 2013-12-09 ENCOUNTER — Ambulatory Visit (INDEPENDENT_AMBULATORY_CARE_PROVIDER_SITE_OTHER): Payer: 59 | Admitting: *Deleted

## 2013-12-09 DIAGNOSIS — Z0000 Encounter for general adult medical examination without abnormal findings: Secondary | ICD-10-CM

## 2013-12-09 DIAGNOSIS — E039 Hypothyroidism, unspecified: Secondary | ICD-10-CM

## 2013-12-09 LAB — CBC WITH DIFFERENTIAL/PLATELET
Basophils Absolute: 0 10*3/uL (ref 0.0–0.1)
Basophils Relative: 0.4 % (ref 0.0–3.0)
EOS PCT: 4 % (ref 0.0–5.0)
Eosinophils Absolute: 0.2 10*3/uL (ref 0.0–0.7)
HCT: 38.8 % (ref 36.0–46.0)
Hemoglobin: 12.9 g/dL (ref 12.0–15.0)
LYMPHS PCT: 34.9 % (ref 12.0–46.0)
Lymphs Abs: 1.6 10*3/uL (ref 0.7–4.0)
MCHC: 33.3 g/dL (ref 30.0–36.0)
MCV: 86 fl (ref 78.0–100.0)
MONOS PCT: 9 % (ref 3.0–12.0)
Monocytes Absolute: 0.4 10*3/uL (ref 0.1–1.0)
NEUTROS PCT: 51.7 % (ref 43.0–77.0)
Neutro Abs: 2.4 10*3/uL (ref 1.4–7.7)
Platelets: 318 10*3/uL (ref 150.0–400.0)
RBC: 4.51 Mil/uL (ref 3.87–5.11)
RDW: 14.3 % (ref 11.5–15.5)
WBC: 4.7 10*3/uL (ref 4.0–10.5)

## 2013-12-09 LAB — URINALYSIS
BILIRUBIN URINE: NEGATIVE
HGB URINE DIPSTICK: NEGATIVE
KETONES UR: NEGATIVE
LEUKOCYTES UA: NEGATIVE
Nitrite: NEGATIVE
Total Protein, Urine: NEGATIVE
URINE GLUCOSE: NEGATIVE
UROBILINOGEN UA: 0.2 (ref 0.0–1.0)
pH: 7 (ref 5.0–8.0)

## 2013-12-09 LAB — LIPID PANEL
Cholesterol: 204 mg/dL — ABNORMAL HIGH (ref 0–200)
HDL: 61.8 mg/dL (ref >39.00)
LDL Cholesterol: 132 mg/dL — ABNORMAL HIGH (ref 0–99)
NONHDL: 142.2
Total CHOL/HDL Ratio: 3
Triglycerides: 52 mg/dL (ref 0.0–149.0)
VLDL: 10.4 mg/dL (ref 0.0–40.0)

## 2013-12-09 LAB — T3, FREE: T3, Free: 2.8 pg/mL (ref 2.3–4.2)

## 2013-12-09 LAB — T4, FREE: Free T4: 1.04 ng/dL (ref 0.60–1.60)

## 2013-12-09 LAB — HEPATIC FUNCTION PANEL
ALBUMIN: 3.9 g/dL (ref 3.5–5.2)
ALT: 11 U/L (ref 0–35)
AST: 20 U/L (ref 0–37)
Alkaline Phosphatase: 77 U/L (ref 39–117)
BILIRUBIN DIRECT: 0 mg/dL (ref 0.0–0.3)
Total Bilirubin: 0.3 mg/dL (ref 0.2–1.2)
Total Protein: 7.3 g/dL (ref 6.0–8.3)

## 2013-12-09 LAB — TSH: TSH: 2.6 u[IU]/mL (ref 0.35–4.50)

## 2013-12-09 LAB — BASIC METABOLIC PANEL
BUN: 11 mg/dL (ref 6–23)
CALCIUM: 8.7 mg/dL (ref 8.4–10.5)
CO2: 25 mEq/L (ref 19–32)
CREATININE: 0.7 mg/dL (ref 0.4–1.2)
Chloride: 105 mEq/L (ref 96–112)
GFR: 113.49 mL/min (ref >60.00)
GLUCOSE: 96 mg/dL (ref 70–99)
POTASSIUM: 3.8 meq/L (ref 3.5–5.1)
Sodium: 138 mEq/L (ref 135–145)

## 2013-12-10 ENCOUNTER — Encounter: Payer: Self-pay | Admitting: Internal Medicine

## 2013-12-10 ENCOUNTER — Ambulatory Visit (INDEPENDENT_AMBULATORY_CARE_PROVIDER_SITE_OTHER): Payer: 59 | Admitting: Internal Medicine

## 2013-12-10 VITALS — BP 120/80 | HR 64 | Temp 98.3°F | Resp 16 | Wt 196.0 lb

## 2013-12-10 DIAGNOSIS — F341 Dysthymic disorder: Secondary | ICD-10-CM

## 2013-12-10 DIAGNOSIS — E039 Hypothyroidism, unspecified: Secondary | ICD-10-CM

## 2013-12-10 DIAGNOSIS — E538 Deficiency of other specified B group vitamins: Secondary | ICD-10-CM

## 2013-12-10 MED ORDER — CHOLECALCIFEROL 125 MCG (5000 UT) PO CAPS: ORAL_CAPSULE | ORAL | Status: DC

## 2013-12-10 NOTE — Patient Instructions (Signed)
Cologuard

## 2013-12-10 NOTE — Progress Notes (Signed)
   Subjective:    HPI   The patient presents for a follow-up of  chronic hypertension, chronic dyslipidemia, hypothyroidism controlled with medicines.   F/u on rash on B LEs - she had seen Derm - no new rash  BP Readings from Last 3 Encounters:  12/10/13 120/80  06/30/13 130/86  06/23/13 140/90   Wt Readings from Last 3 Encounters:  12/10/13 196 lb (88.905 kg)  06/30/13 191 lb (86.637 kg)  06/23/13 192 lb (87.091 kg)       Review of Systems  Constitutional: Negative for chills, activity change, appetite change, fatigue and unexpected weight change.  HENT: Negative for congestion, mouth sores and sinus pressure.   Eyes: Negative for visual disturbance.  Respiratory: Negative for cough and chest tightness.   Gastrointestinal: Negative for nausea and abdominal pain.  Genitourinary: Negative for frequency, difficulty urinating and vaginal pain.  Musculoskeletal: Negative for back pain and gait problem.  Skin: Positive for rash. Negative for pallor.  Neurological: Negative for dizziness, tremors, weakness, numbness and headaches.  Psychiatric/Behavioral: Negative for confusion and sleep disturbance.       Objective:   Physical Exam  Constitutional: She appears well-developed. No distress.  Obese   HENT:  Head: Normocephalic.  Right Ear: External ear normal.  Left Ear: External ear normal.  Nose: Nose normal.  Mouth/Throat: Oropharynx is clear and moist.  Eyes: Conjunctivae are normal. Pupils are equal, round, and reactive to light. Right eye exhibits no discharge. Left eye exhibits no discharge.  Neck: Normal range of motion. Neck supple. No JVD present. No tracheal deviation present. Thyromegaly (nodular goiter) present.  Cardiovascular: Normal rate, regular rhythm and normal heart sounds.   Pulmonary/Chest: No stridor. No respiratory distress. She has no wheezes.  Abdominal: Soft. Bowel sounds are normal. She exhibits no distension and no mass. There is no  tenderness. There is no rebound and no guarding.  Musculoskeletal: She exhibits no edema and no tenderness.  Lymphadenopathy:    She has no cervical adenopathy.  Neurological: She displays normal reflexes. No cranial nerve deficit. She exhibits normal muscle tone. Coordination normal.  Skin: Rash (B lat dist shins) noted. No erythema.  Psychiatric: She has a normal mood and affect. Her behavior is normal. Judgment and thought content normal.   Lab Results  Component Value Date   WBC 4.7 12/09/2013   HGB 12.9 12/09/2013   HCT 38.8 12/09/2013   PLT 318.0 12/09/2013   GLUCOSE 96 12/09/2013   CHOL 204* 12/09/2013   TRIG 52.0 12/09/2013   HDL 61.80 12/09/2013   LDLDIRECT 133.5 04/28/2013   LDLCALC 132* 12/09/2013   ALT 11 12/09/2013   AST 20 12/09/2013   NA 138 12/09/2013   K 3.8 12/09/2013   CL 105 12/09/2013   CREATININE 0.7 12/09/2013   BUN 11 12/09/2013   CO2 25 12/09/2013   TSH 2.60 12/09/2013          Assessment & Plan:

## 2013-12-10 NOTE — Progress Notes (Signed)
Pre visit review using our clinic review tool, if applicable. No additional management support is needed unless otherwise documented below in the visit note. 

## 2013-12-13 ENCOUNTER — Encounter: Payer: Self-pay | Admitting: Internal Medicine

## 2013-12-19 NOTE — Assessment & Plan Note (Signed)
Continue with current prescription therapy as reflected on the Med list.  

## 2013-12-19 NOTE — Assessment & Plan Note (Signed)
Doing well 

## 2014-01-20 ENCOUNTER — Encounter: Payer: Self-pay | Admitting: *Deleted

## 2014-01-24 ENCOUNTER — Encounter: Payer: Self-pay | Admitting: Internal Medicine

## 2014-04-08 ENCOUNTER — Encounter: Payer: Self-pay | Admitting: Internal Medicine

## 2014-04-08 ENCOUNTER — Other Ambulatory Visit: Payer: Self-pay | Admitting: *Deleted

## 2014-04-08 MED ORDER — LOSARTAN POTASSIUM 50 MG PO TABS: 50.0000 mg | ORAL_TABLET | Freq: Every day | ORAL | Status: DC

## 2014-04-08 MED ORDER — LEVOTHYROXINE SODIUM 50 MCG PO TABS: 50.0000 ug | ORAL_TABLET | Freq: Every day | ORAL | Status: DC

## 2014-04-08 NOTE — Telephone Encounter (Signed)
Sent email requesting refills to be sent to [facility] in [place] for losartan & levothyroxine...Johny Chess

## 2014-06-10 ENCOUNTER — Encounter: Payer: 59 | Admitting: Internal Medicine

## 2014-06-23 ENCOUNTER — Ambulatory Visit: Payer: Self-pay | Admitting: Internal Medicine

## 2014-06-23 ENCOUNTER — Encounter: Payer: Self-pay | Admitting: Internal Medicine

## 2014-07-21 ENCOUNTER — Encounter: Payer: Self-pay | Admitting: Internal Medicine

## 2014-07-25 ENCOUNTER — Encounter: Payer: Self-pay | Admitting: Internal Medicine

## 2014-07-26 ENCOUNTER — Encounter: Payer: Self-pay | Admitting: Internal Medicine

## 2014-07-29 ENCOUNTER — Other Ambulatory Visit: Payer: 59

## 2014-07-29 ENCOUNTER — Ambulatory Visit (INDEPENDENT_AMBULATORY_CARE_PROVIDER_SITE_OTHER): Payer: 59 | Admitting: Internal Medicine

## 2014-07-29 ENCOUNTER — Other Ambulatory Visit (INDEPENDENT_AMBULATORY_CARE_PROVIDER_SITE_OTHER): Payer: 59 | Admitting: *Deleted

## 2014-07-29 ENCOUNTER — Encounter: Payer: Self-pay | Admitting: Internal Medicine

## 2014-07-29 ENCOUNTER — Telehealth: Payer: Self-pay | Admitting: Internal Medicine

## 2014-07-29 VITALS — BP 138/90 | HR 75 | Wt 187.0 lb

## 2014-07-29 DIAGNOSIS — E559 Vitamin D deficiency, unspecified: Secondary | ICD-10-CM

## 2014-07-29 DIAGNOSIS — E538 Deficiency of other specified B group vitamins: Secondary | ICD-10-CM

## 2014-07-29 DIAGNOSIS — E039 Hypothyroidism, unspecified: Secondary | ICD-10-CM | POA: Diagnosis not present

## 2014-07-29 DIAGNOSIS — E034 Atrophy of thyroid (acquired): Secondary | ICD-10-CM | POA: Diagnosis not present

## 2014-07-29 DIAGNOSIS — Z0000 Encounter for general adult medical examination without abnormal findings: Secondary | ICD-10-CM

## 2014-07-29 DIAGNOSIS — E038 Other specified hypothyroidism: Secondary | ICD-10-CM | POA: Diagnosis not present

## 2014-07-29 DIAGNOSIS — I10 Essential (primary) hypertension: Secondary | ICD-10-CM

## 2014-07-29 DIAGNOSIS — E0590 Thyrotoxicosis, unspecified without thyrotoxic crisis or storm: Secondary | ICD-10-CM

## 2014-07-29 LAB — CBC WITH DIFFERENTIAL/PLATELET
Basophils Absolute: 0 10*3/uL (ref 0.0–0.1)
Basophils Relative: 0.6 % (ref 0.0–3.0)
EOS PCT: 2.8 % (ref 0.0–5.0)
Eosinophils Absolute: 0.2 10*3/uL (ref 0.0–0.7)
HCT: 40.1 % (ref 36.0–46.0)
Hemoglobin: 13.6 g/dL (ref 12.0–15.0)
Lymphocytes Relative: 39 % (ref 12.0–46.0)
Lymphs Abs: 2.2 10*3/uL (ref 0.7–4.0)
MCHC: 34 g/dL (ref 30.0–36.0)
MCV: 83.7 fl (ref 78.0–100.0)
MONOS PCT: 8.4 % (ref 3.0–12.0)
Monocytes Absolute: 0.5 10*3/uL (ref 0.1–1.0)
NEUTROS PCT: 49.2 % (ref 43.0–77.0)
Neutro Abs: 2.8 10*3/uL (ref 1.4–7.7)
PLATELETS: 322 10*3/uL (ref 150.0–400.0)
RBC: 4.79 Mil/uL (ref 3.87–5.11)
RDW: 14.4 % (ref 11.5–15.5)
WBC: 5.8 10*3/uL (ref 4.0–10.5)

## 2014-07-29 LAB — VITAMIN B12: Vitamin B-12: 222 pg/mL (ref 211–911)

## 2014-07-29 LAB — BASIC METABOLIC PANEL
BUN: 14 mg/dL (ref 6–23)
CO2: 30 mEq/L (ref 19–32)
Calcium: 9.3 mg/dL (ref 8.4–10.5)
Chloride: 105 mEq/L (ref 96–112)
Creatinine, Ser: 0.7 mg/dL (ref 0.40–1.20)
GFR: 111.36 mL/min (ref >60.00)
Glucose, Bld: 87 mg/dL (ref 70–99)
POTASSIUM: 3.7 meq/L (ref 3.5–5.1)
SODIUM: 138 meq/L (ref 135–145)

## 2014-07-29 LAB — HEPATIC FUNCTION PANEL
ALBUMIN: 4.1 g/dL (ref 3.5–5.2)
ALT: 8 U/L (ref 0–35)
AST: 13 U/L (ref 0–37)
Alkaline Phosphatase: 74 U/L (ref 39–117)
Bilirubin, Direct: 0.1 mg/dL (ref 0.0–0.3)
TOTAL PROTEIN: 7.1 g/dL (ref 6.0–8.3)
Total Bilirubin: 0.3 mg/dL (ref 0.2–1.2)

## 2014-07-29 LAB — LIPID PANEL
CHOL/HDL RATIO: 3
Cholesterol: 196 mg/dL (ref 0–200)
HDL: 63.4 mg/dL (ref >39.00)
LDL Cholesterol: 107 mg/dL — ABNORMAL HIGH (ref 0–99)
NonHDL: 132.6
Triglycerides: 129 mg/dL (ref 0.0–149.0)
VLDL: 25.8 mg/dL (ref 0.0–40.0)

## 2014-07-29 LAB — GLUCOSE, POCT (MANUAL RESULT ENTRY): POC Glucose: 85 mg/dl (ref 70–99)

## 2014-07-29 LAB — VITAMIN D 25 HYDROXY (VIT D DEFICIENCY, FRACTURES): VITD: 22.24 ng/mL — ABNORMAL LOW (ref 30.00–100.00)

## 2014-07-29 LAB — TSH: TSH: 7.28 u[IU]/mL — ABNORMAL HIGH (ref 0.35–4.50)

## 2014-07-29 LAB — T4, FREE: Free T4: 0.85 ng/dL (ref 0.60–1.60)

## 2014-07-29 LAB — SEDIMENTATION RATE: Sed Rate: 16 mm/hr (ref 0–22)

## 2014-07-29 NOTE — Addendum Note (Signed)
Addended by: Eulis Foster on: 07/29/2014 04:06 PM   Modules accepted: Orders

## 2014-07-29 NOTE — Telephone Encounter (Signed)
Patient has lab work entered.  She goes to [address] to have her labs done.  She would like to make sure the orders are good for her to have done there.

## 2014-07-29 NOTE — Progress Notes (Signed)
   Subjective:    HPI   The patient is here for a wellness exam.   C/o fatigue, fluid retention, palpitations Driving a big truck  BP Readings from Last 3 Encounters:  07/29/14 138/90  12/10/13 120/80  06/30/13 130/86   Wt Readings from Last 3 Encounters:  07/29/14 187 lb (84.823 kg)  12/10/13 196 lb (88.905 kg)  06/30/13 191 lb (86.637 kg)       Review of Systems  Constitutional: Negative for chills, activity change, appetite change, fatigue and unexpected weight change.  HENT: Negative for congestion, mouth sores and sinus pressure.   Eyes: Negative for visual disturbance.  Respiratory: Negative for cough and chest tightness.   Gastrointestinal: Negative for nausea and abdominal pain.  Genitourinary: Negative for frequency, difficulty urinating and vaginal pain.  Musculoskeletal: Negative for back pain and gait problem.  Skin: Positive for rash. Negative for pallor.  Neurological: Negative for dizziness, tremors, weakness, numbness and headaches.  Psychiatric/Behavioral: Negative for confusion and sleep disturbance.       Objective:   Physical Exam  Constitutional: She appears well-developed. No distress.  Obese   HENT:  Head: Normocephalic.  Right Ear: External ear normal.  Left Ear: External ear normal.  Nose: Nose normal.  Mouth/Throat: Oropharynx is clear and moist.  Eyes: Conjunctivae are normal. Pupils are equal, round, and reactive to light. Right eye exhibits no discharge. Left eye exhibits no discharge.  Neck: Normal range of motion. Neck supple. No JVD present. No tracheal deviation present. Thyromegaly (nodular goiter) present.  Cardiovascular: Normal rate, regular rhythm and normal heart sounds.   Pulmonary/Chest: No stridor. No respiratory distress. She has no wheezes.  Abdominal: Soft. Bowel sounds are normal. She exhibits no distension and no mass. There is no tenderness. There is no rebound and no guarding.  Musculoskeletal: She exhibits no  edema or tenderness.  Lymphadenopathy:    She has no cervical adenopathy.  Neurological: She displays normal reflexes. No cranial nerve deficit. She exhibits normal muscle tone. Coordination normal.  Skin: Rash (B lat dist shins) noted. No erythema.  Psychiatric: She has a normal mood and affect. Her behavior is normal. Judgment and thought content normal.   Lab Results  Component Value Date   WBC 4.7 12/09/2013   HGB 12.9 12/09/2013   HCT 38.8 12/09/2013   PLT 318.0 12/09/2013   GLUCOSE 96 12/09/2013   CHOL 204* 12/09/2013   TRIG 52.0 12/09/2013   HDL 61.80 12/09/2013   LDLDIRECT 133.5 04/28/2013   LDLCALC 132* 12/09/2013   ALT 11 12/09/2013   AST 20 12/09/2013   NA 138 12/09/2013   K 3.8 12/09/2013   CL 105 12/09/2013   CREATININE 0.7 12/09/2013   BUN 11 12/09/2013   CO2 25 12/09/2013   TSH 2.60 12/09/2013          Assessment & Plan:

## 2014-07-29 NOTE — Progress Notes (Signed)
Pre visit review using our clinic review tool, if applicable. No additional management support is needed unless otherwise documented below in the visit note. 

## 2014-07-29 NOTE — Addendum Note (Signed)
Addended by: Eulis Foster on: 07/29/2014 04:08 PM   Modules accepted: Orders

## 2014-07-30 LAB — URINALYSIS
Bilirubin Urine: NEGATIVE
Glucose, UA: NEGATIVE mg/dL
Ketones, ur: NEGATIVE mg/dL
Leukocytes, UA: NEGATIVE
NITRITE: NEGATIVE
PROTEIN: NEGATIVE mg/dL
Specific Gravity, Urine: <1.005 — ABNORMAL LOW (ref 1.005–1.030)
UROBILINOGEN UA: 0.2 mg/dL (ref 0.0–1.0)
pH: 5.5 (ref 5.0–8.0)

## 2014-08-01 ENCOUNTER — Other Ambulatory Visit: Payer: Self-pay | Admitting: Internal Medicine

## 2014-08-01 DIAGNOSIS — E538 Deficiency of other specified B group vitamins: Secondary | ICD-10-CM

## 2014-08-01 DIAGNOSIS — E034 Atrophy of thyroid (acquired): Secondary | ICD-10-CM

## 2014-08-01 DIAGNOSIS — E559 Vitamin D deficiency, unspecified: Secondary | ICD-10-CM

## 2014-08-01 MED ORDER — ERGOCALCIFEROL 1.25 MG (50000 UT) PO CAPS: 50000.0000 [IU] | ORAL_CAPSULE | ORAL | Status: DC

## 2014-08-01 MED ORDER — VITAMIN B-12 500 MCG SL SUBL: 1.0000 | SUBLINGUAL_TABLET | SUBLINGUAL | Status: DC

## 2014-08-01 NOTE — Assessment & Plan Note (Signed)
Worse Vit D 50000 iu qwk x6

## 2014-08-01 NOTE — Assessment & Plan Note (Signed)
On Levothroid Repeat TSH, FT4  Lab Results  Component Value Date   TSH 7.28* 07/29/2014

## 2014-08-01 NOTE — Telephone Encounter (Signed)
I called [facility] lab and asked how to enter labs so they could see/draw them. Labs entered. Left detailed mess informing pt.

## 2014-08-01 NOTE — Assessment & Plan Note (Signed)
5/16 - start Losartan

## 2014-08-01 NOTE — Assessment & Plan Note (Signed)
B12 SL 

## 2014-08-04 ENCOUNTER — Encounter: Payer: Self-pay | Admitting: Internal Medicine

## 2014-08-05 ENCOUNTER — Ambulatory Visit: Payer: Self-pay | Admitting: Certified Nurse Midwife

## 2014-08-23 NOTE — Telephone Encounter (Signed)
Patient is requesting scripts to be sent to [facility] [address] in [facility].  Ph number [phone].  Should be script only for vit D2.

## 2014-08-24 ENCOUNTER — Telehealth: Payer: Self-pay | Admitting: Internal Medicine

## 2014-08-24 MED ORDER — ERGOCALCIFEROL 1.25 MG (50000 UT) PO CAPS: 50000.0000 [IU] | ORAL_CAPSULE | ORAL | Status: DC

## 2014-08-24 NOTE — Telephone Encounter (Signed)
Pharmacy [facility] on [address]. In [place] [phone]. Patient is requesting a refill for ergocalciferol (VITAMIN D2) 50000 UNITS capsule [[id number]]

## 2014-08-24 NOTE — Telephone Encounter (Signed)
Rf sent- see meds. Pt informed

## 2014-08-29 NOTE — Telephone Encounter (Signed)
Patient would like to know if she needs to take regular dose along with the once a week dose.

## 2014-08-30 NOTE — Telephone Encounter (Signed)
Pt informed take Vit D 50,000IU once weekly until gone, then start Vit D 1000IU daily per MD. See 07/29/14 labs.

## 2014-09-28 ENCOUNTER — Ambulatory Visit: Payer: Self-pay | Admitting: Certified Nurse Midwife

## 2014-11-22 ENCOUNTER — Ambulatory Visit: Payer: Self-pay | Admitting: Certified Nurse Midwife

## 2014-12-01 ENCOUNTER — Encounter: Payer: Self-pay | Admitting: Certified Nurse Midwife

## 2014-12-01 ENCOUNTER — Ambulatory Visit (INDEPENDENT_AMBULATORY_CARE_PROVIDER_SITE_OTHER): Payer: 59 | Admitting: Certified Nurse Midwife

## 2014-12-01 ENCOUNTER — Encounter: Payer: Self-pay | Admitting: Internal Medicine

## 2014-12-01 VITALS — BP 120/80 | HR 70 | Resp 20 | Ht 62.25 in | Wt 189.0 lb

## 2014-12-01 DIAGNOSIS — N952 Postmenopausal atrophic vaginitis: Secondary | ICD-10-CM | POA: Diagnosis not present

## 2014-12-01 DIAGNOSIS — Z01419 Encounter for gynecological examination (general) (routine) without abnormal findings: Secondary | ICD-10-CM

## 2014-12-01 DIAGNOSIS — Z780 Asymptomatic menopausal state: Secondary | ICD-10-CM

## 2014-12-01 MED ORDER — ESTROGENS, CONJUGATED 0.625 MG/GM VA CREA: 1.0000 | TOPICAL_CREAM | VAGINAL | Status: DC

## 2014-12-01 NOTE — Progress Notes (Signed)
54 y.o. G1P1001 Married  African American Fe here for annual exam. Menopausal no ERT.  Denies vaginal bleeding, vaginal dryness better Premarin cream. Sees PCP Dr. Erline Levine for medication management of Hypothyroid/Hypertension/Vit D deficiency/aex/labs. All stable per patient. Working on exercise with running and walking and diet control.  No health issues today. Took trip [place]!  No LMP recorded. Patient has had a hysterectomy.          Sexually active: Yes.    The current method of family planning is status post hysterectomy.    Exercising: Yes.    walking & running Smoker:  no  Health Maintenance: Pap: 03-22-10 neg MMG: 10-11-13 category a density,birads 1:neg Colonoscopy:  none BMD:   2012 normal TDaP:  2012 Labs:pcp  Self breast exam: done daily   reports that she has never smoked. She does not have any smokeless tobacco history on file. She reports that she does not drink alcohol or use illicit drugs.  Past Medical History  Diagnosis Date  . Low back pain   . Leg pain   . Accidental fall     on or from other stairs or steps  . Contusion of face, scalp, and neck except eye(s)   . Unspecified pruritic disorder   . Allergic rhinitis, cause unspecified   . Rash and other nonspecific skin eruption   . Unspecified vitamin D deficiency   . Pain in limb   . Chest pain, unspecified   . Cervicalgia   . Hypertension 2011  . Fibroid   . 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 Hypothyroidism 2011    Past Surgical History  Procedure Laterality Date  . Abdominal hysterectomy  1999    TAH, ovaries retained    Current Outpatient Prescriptions  Medication Sig Dispense Refill  . BIOTIN PO Take by mouth daily.    . Cholecalciferol (VITAMIN D PO) Take 1,000 Int'l Units by mouth daily.    Marland Kitchen conjugated estrogens (PREMARIN) vaginal cream Place 1 Applicatorful vaginally 2 (two) times a week.    . levothyroxine (SYNTHROID, LEVOTHROID) 50 MCG tablet Take  1 tablet (50 mcg total) by mouth daily before breakfast. 90 tablet 3  . losartan (COZAAR) 50 MG tablet Take 1 tablet (50 mg total) by mouth daily. 90 tablet 3  . Multiple Vitamins-Minerals (MULTIVITAMIN PO) Take by mouth daily.    . Omega-3 Fatty Acids (FISH OIL PO) Take by mouth daily.     No current facility-administered medications for this visit.    Family History  Problem Relation Age of Onset  . Coronary artery disease Neg Hx   . Thyroid disease Neg Hx   . Early death Mother   . Diabetes Sister   . Diabetes Paternal Uncle     ROS:  Pertinent items are noted in HPI.  Otherwise, a comprehensive ROS was negative.  Exam:   BP 120/80 mmHg  Pulse 70  Resp 20  Ht 5' 2.25" (1.581 m)  Wt 189 lb (85.73 kg)  BMI 34.30 kg/m2 Height: 5' 2.25" (158.1 cm) Ht Readings from Last 3 Encounters:  12/01/14 5' 2.25" (1.581 m)  06/30/13 5' 2.5" (1.588 m)  04/30/13 5\' 3"  (1.6 m)    General appearance: alert, cooperative and appears stated age Head: Normocephalic, without obvious abnormality, atraumatic Neck: no adenopathy, supple, symmetrical, trachea midline and thyroid normal to inspection and palpation Lungs: clear to auscultation bilaterally Breasts: normal appearance, no masses or tenderness, No nipple retraction or dimpling, No  nipple discharge or bleeding, No axillary or supraclavicular adenopathy, large/pendulous Heart: regular rate and rhythm Abdomen: soft, non-tender; no masses,  no organomegaly Extremities: extremities normal, atraumatic, no cyanosis or edema Skin: Skin color, texture, turgor normal. No rashes or lesions Lymph nodes: Cervical, supraclavicular, and axillary nodes normal. No abnormal inguinal nodes palpated Neurologic: Grossly normal   Pelvic: External genitalia:  no lesions              Urethra:  normal appearing urethra with no masses, tenderness or lesions              Bartholin's and Skene's: normal                 Vagina: normal appearing vagina with  normal color and discharge, no lesions              Cervix: absent              Pap taken: No. Bimanual Exam:  Uterus:  uterus absent              Adnexa: normal adnexa and no mass, fullness, tenderness               Rectovaginal: Confirms               Anus:  normal sphincter tone, no lesions   A:  Well Woman with normal exam  Menopausal no ERT S/P TAH for fibroids, ovaries retained  Hypertension/Hypothyroid/Vitamin D deficiency with PCP management  Colonoscopy due  Cologard with PCP  BMD due  P:   Reviewed health and wellness pertinent to exam  Continue follow up with PCP as indicated  Pap smear as above not taken  Patient aware of colonoscopy due and plans to schedule with PCP this year.  Will schedule her own BMD aware it is due.   counseled on breast self exam, mammography screening, adequate intake of calcium and vitamin D, diet and exercise  return annually or prn   An After Visit Summary was printed and given to the patient.

## 2014-12-01 NOTE — Patient Instructions (Signed)

## 2014-12-02 NOTE — Progress Notes (Signed)
Reviewed personally.  M. Suzanne Jovin Fester, MD.  

## 2014-12-29 LAB — HM MAMMOGRAPHY

## 2015-01-03 ENCOUNTER — Encounter: Payer: Self-pay | Admitting: Internal Medicine

## 2015-02-03 ENCOUNTER — Ambulatory Visit: Payer: 59 | Admitting: Internal Medicine

## 2015-03-21 ENCOUNTER — Encounter: Payer: Self-pay | Admitting: Internal Medicine

## 2015-03-22 ENCOUNTER — Other Ambulatory Visit: Payer: Self-pay | Admitting: *Deleted

## 2015-03-22 MED ORDER — LEVOTHYROXINE SODIUM 50 MCG PO TABS
50.0000 ug | ORAL_TABLET | Freq: Every day | ORAL | Status: DC
Start: 2015-03-22 — End: 2015-08-04

## 2015-03-22 MED ORDER — LOSARTAN POTASSIUM 50 MG PO TABS: 50.0000 mg | ORAL_TABLET | Freq: Every day | ORAL | Status: DC

## 2015-04-26 ENCOUNTER — Ambulatory Visit: Payer: Self-pay | Admitting: Internal Medicine

## 2015-05-25 ENCOUNTER — Ambulatory Visit: Payer: Self-pay | Admitting: Internal Medicine

## 2015-06-27 ENCOUNTER — Ambulatory Visit: Payer: Self-pay | Admitting: Internal Medicine

## 2015-07-05 ENCOUNTER — Ambulatory Visit: Payer: Self-pay | Admitting: Internal Medicine

## 2015-08-04 ENCOUNTER — Encounter: Payer: Self-pay | Admitting: Internal Medicine

## 2015-08-04 ENCOUNTER — Other Ambulatory Visit (INDEPENDENT_AMBULATORY_CARE_PROVIDER_SITE_OTHER): Payer: BLUE CROSS/BLUE SHIELD

## 2015-08-04 ENCOUNTER — Ambulatory Visit (INDEPENDENT_AMBULATORY_CARE_PROVIDER_SITE_OTHER): Payer: BLUE CROSS/BLUE SHIELD | Admitting: Internal Medicine

## 2015-08-04 VITALS — BP 138/88 | HR 84 | Wt 198.0 lb

## 2015-08-04 DIAGNOSIS — F341 Dysthymic disorder: Secondary | ICD-10-CM

## 2015-08-04 DIAGNOSIS — E538 Deficiency of other specified B group vitamins: Secondary | ICD-10-CM | POA: Diagnosis not present

## 2015-08-04 DIAGNOSIS — E034 Atrophy of thyroid (acquired): Secondary | ICD-10-CM

## 2015-08-04 DIAGNOSIS — M5440 Lumbago with sciatica, unspecified side: Secondary | ICD-10-CM

## 2015-08-04 DIAGNOSIS — E038 Other specified hypothyroidism: Secondary | ICD-10-CM

## 2015-08-04 DIAGNOSIS — H01009 Unspecified blepharitis unspecified eye, unspecified eyelid: Secondary | ICD-10-CM

## 2015-08-04 DIAGNOSIS — I10 Essential (primary) hypertension: Secondary | ICD-10-CM

## 2015-08-04 LAB — BASIC METABOLIC PANEL
BUN: 9 mg/dL (ref 6–23)
CO2: 26 mEq/L (ref 19–32)
CREATININE: 0.72 mg/dL (ref 0.40–1.20)
Calcium: 9.4 mg/dL (ref 8.4–10.5)
Chloride: 108 mEq/L (ref 96–112)
GFR: 107.41 mL/min (ref >60.00)
Glucose, Bld: 93 mg/dL (ref 70–99)
Potassium: 3.9 mEq/L (ref 3.5–5.1)
Sodium: 143 mEq/L (ref 135–145)

## 2015-08-04 LAB — T4, FREE: FREE T4: 0.98 ng/dL (ref 0.60–1.60)

## 2015-08-04 LAB — TSH: TSH: 12.39 u[IU]/mL — AB (ref 0.35–4.50)

## 2015-08-04 LAB — VITAMIN B12: Vitamin B-12: 591 pg/mL (ref 211–911)

## 2015-08-04 LAB — VITAMIN D 25 HYDROXY (VIT D DEFICIENCY, FRACTURES): VITD: 33.49 ng/mL (ref 30.00–100.00)

## 2015-08-04 MED ORDER — LEVOTHYROXINE SODIUM 50 MCG PO TABS: 50.0000 ug | ORAL_TABLET | Freq: Every day | ORAL | Status: DC

## 2015-08-04 MED ORDER — LOSARTAN POTASSIUM 50 MG PO TABS: 50.0000 mg | ORAL_TABLET | Freq: Every day | ORAL | Status: DC

## 2015-08-04 MED ORDER — ERYTHROMYCIN 5 MG/GM OP OINT: 1.0000 "application " | TOPICAL_OINTMENT | Freq: Every day | OPHTHALMIC | Status: DC

## 2015-08-04 NOTE — Assessment & Plan Note (Signed)
Chronic. 

## 2015-08-04 NOTE — Progress Notes (Signed)
Subjective:  Patient ID: Krystal Gibbs, female    DOB: 01-05-1961  Age: 55 y.o. MRN: [id number]  CC: No chief complaint on file.   HPI Krystal Gibbs presents for HTN, hypothyroidism. C/o blepharitis  Outpatient Prescriptions Prior to Visit  Medication Sig Dispense Refill  . BIOTIN PO Take by mouth daily.    . Cholecalciferol (VITAMIN D PO) Take 1,000 Int'l Units by mouth daily.    Marland Kitchen conjugated estrogens (PREMARIN) vaginal cream Place 1 Applicatorful vaginally 2 (two) times a week. 42.5 g 4  . Multiple Vitamins-Minerals (MULTIVITAMIN PO) Take by mouth daily.    . Omega-3 Fatty Acids (FISH OIL PO) Take by mouth daily.    Marland Kitchen levothyroxine (SYNTHROID, LEVOTHROID) 50 MCG tablet Take 1 tablet (50 mcg total) by mouth daily before breakfast. 90 tablet 1  . losartan (COZAAR) 50 MG tablet Take 1 tablet (50 mg total) by mouth daily. 90 tablet 1   No facility-administered medications prior to visit.    ROS Review of Systems  Constitutional: Negative for chills, activity change, appetite change, fatigue and unexpected weight change.  HENT: Negative for congestion, mouth sores and sinus pressure.   Eyes: Negative for visual disturbance.  Respiratory: Negative for cough and chest tightness.   Gastrointestinal: Negative for nausea and abdominal pain.  Genitourinary: Negative for frequency, difficulty urinating and vaginal pain.  Musculoskeletal: Negative for back pain and gait problem.  Skin: Negative for pallor and rash.  Neurological: Negative for dizziness, tremors, weakness, numbness and headaches.  Psychiatric/Behavioral: Negative for confusion and sleep disturbance. The patient is not nervous/anxious.     Objective:  BP 138/88 mmHg  Pulse 84  Wt 198 lb (89.812 kg)  SpO2 96%  BP Readings from Last 3 Encounters:  08/04/15 138/88  12/01/14 120/80  07/29/14 138/90    Wt Readings from Last 3 Encounters:  08/04/15 198 lb (89.812 kg)  12/01/14 189 lb (85.73 kg)  07/29/14 187  lb (84.823 kg)    Physical Exam  Constitutional: She appears well-developed. No distress.  HENT:  Head: Normocephalic.  Right Ear: External ear normal.  Left Ear: External ear normal.  Nose: Nose normal.  Mouth/Throat: Oropharynx is clear and moist.  Eyes: Conjunctivae are normal. Pupils are equal, round, and reactive to light. Right eye exhibits no discharge. Left eye exhibits no discharge.  Neck: Normal range of motion. Neck supple. No JVD present. No tracheal deviation present. No thyromegaly present.  Cardiovascular: Normal rate, regular rhythm and normal heart sounds.   Pulmonary/Chest: No stridor. No respiratory distress. She has no wheezes.  Abdominal: Soft. Bowel sounds are normal. She exhibits no distension and no mass. There is no tenderness. There is no rebound and no guarding.  Musculoskeletal: She exhibits no edema or tenderness.  Lymphadenopathy:    She has no cervical adenopathy.  Neurological: She displays normal reflexes. No cranial nerve deficit. She exhibits normal muscle tone. Coordination normal.  Skin: No rash noted. No erythema.  Psychiatric: She has a normal mood and affect. Her behavior is normal. Judgment and thought content normal.    Lab Results  Component Value Date   WBC 5.8 07/29/2014   HGB 13.6 07/29/2014   HCT 40.1 07/29/2014   PLT 322.0 07/29/2014   GLUCOSE 87 07/29/2014   CHOL 196 07/29/2014   TRIG 129.0 07/29/2014   HDL 63.40 07/29/2014   LDLDIRECT 133.5 04/28/2013   LDLCALC 107* 07/29/2014   ALT 8 07/29/2014   AST 13 07/29/2014   NA 138 07/29/2014  K 3.7 07/29/2014   CL 105 07/29/2014   CREATININE 0.70 07/29/2014   BUN 14 07/29/2014   CO2 30 07/29/2014   TSH 7.28* 07/29/2014    No results found.  Assessment & Plan:   Diagnoses and all orders for this visit:  Essential hypertension -     Vitamin B12; Future -     VITAMIN D 25 Hydroxy (Vit-D Deficiency, Fractures); Future -     T4, free; Future -     TSH; Future -     Basic  metabolic panel; Future  B12 deficiency -     Vitamin B12; Future -     VITAMIN D 25 Hydroxy (Vit-D Deficiency, Fractures); Future -     T4, free; Future -     TSH; Future -     Basic metabolic panel; Future  Hypothyroidism due to acquired atrophy of thyroid -     Vitamin B12; Future -     VITAMIN D 25 Hydroxy (Vit-D Deficiency, Fractures); Future -     T4, free; Future -     TSH; Future -     Basic metabolic panel; Future  DEPRESSION/ANXIETY -     Vitamin B12; Future -     VITAMIN D 25 Hydroxy (Vit-D Deficiency, Fractures); Future -     T4, free; Future -     TSH; Future -     Basic metabolic panel; Future  Low back pain with sciatica, sciatica laterality unspecified, unspecified back pain laterality, unspecified chronicity -     Vitamin B12; Future -     VITAMIN D 25 Hydroxy (Vit-D Deficiency, Fractures); Future -     T4, free; Future -     TSH; Future -     Basic metabolic panel; Future  Blepharitis, unspecified laterality  Other orders -     losartan (COZAAR) 50 MG tablet; Take 1 tablet (50 mg total) by mouth daily. -     levothyroxine (SYNTHROID, LEVOTHROID) 50 MCG tablet; Take 1 tablet (50 mcg total) by mouth daily before breakfast. -     erythromycin ophthalmic ointment; Place 1 application into both eyes at bedtime.   I am having Ms. Swatek start on erythromycin. I am also having her maintain her Multiple Vitamins-Minerals (MULTIVITAMIN PO), BIOTIN PO, Cholecalciferol (VITAMIN D PO), Omega-3 Fatty Acids (FISH OIL PO), conjugated estrogens, losartan, and levothyroxine.  Meds ordered this encounter  Medications  . losartan (COZAAR) 50 MG tablet    Sig: Take 1 tablet (50 mg total) by mouth daily.    Dispense:  90 tablet    Refill:  3  . levothyroxine (SYNTHROID, LEVOTHROID) 50 MCG tablet    Sig: Take 1 tablet (50 mcg total) by mouth daily before breakfast.    Dispense:  90 tablet    Refill:  3  . erythromycin ophthalmic ointment    Sig: Place 1 application  into both eyes at bedtime.    Dispense:  3.5 g    Refill:  1     Follow-up: Return in about 6 months (around 02/04/2016) for a follow-up visit.  Walker Kehr, MD

## 2015-08-04 NOTE — Assessment & Plan Note (Signed)
On B12 

## 2015-08-04 NOTE — Progress Notes (Signed)
Pre visit review using our clinic review tool, if applicable. No additional management support is needed unless otherwise documented below in the visit note. 

## 2015-08-04 NOTE — Assessment & Plan Note (Signed)
5/17 ERYTHRO OINT

## 2015-08-04 NOTE — Assessment & Plan Note (Signed)
Losartan 

## 2015-08-04 NOTE — Assessment & Plan Note (Signed)
Doing well 

## 2015-08-04 NOTE — Assessment & Plan Note (Signed)
On Levothroid 

## 2015-08-11 ENCOUNTER — Other Ambulatory Visit: Payer: Self-pay | Admitting: Internal Medicine

## 2015-08-11 DIAGNOSIS — E034 Atrophy of thyroid (acquired): Secondary | ICD-10-CM

## 2015-12-06 ENCOUNTER — Ambulatory Visit: Payer: 59 | Admitting: Certified Nurse Midwife

## 2016-02-02 ENCOUNTER — Ambulatory Visit: Payer: BLUE CROSS/BLUE SHIELD | Admitting: Internal Medicine

## 2016-02-03 LAB — HM MAMMOGRAPHY

## 2016-02-09 ENCOUNTER — Encounter: Payer: Self-pay | Admitting: Internal Medicine

## 2016-02-09 ENCOUNTER — Encounter: Payer: Self-pay | Admitting: Certified Nurse Midwife

## 2016-02-22 ENCOUNTER — Encounter: Payer: Self-pay | Admitting: Certified Nurse Midwife

## 2016-02-22 ENCOUNTER — Ambulatory Visit (INDEPENDENT_AMBULATORY_CARE_PROVIDER_SITE_OTHER): Payer: BLUE CROSS/BLUE SHIELD | Admitting: Certified Nurse Midwife

## 2016-02-22 VITALS — BP 122/80 | HR 68 | Resp 16 | Ht 62.25 in | Wt 193.0 lb

## 2016-02-22 DIAGNOSIS — Z0000 Encounter for general adult medical examination without abnormal findings: Secondary | ICD-10-CM | POA: Diagnosis not present

## 2016-02-22 DIAGNOSIS — Z01419 Encounter for gynecological examination (general) (routine) without abnormal findings: Secondary | ICD-10-CM

## 2016-02-22 DIAGNOSIS — N952 Postmenopausal atrophic vaginitis: Secondary | ICD-10-CM | POA: Diagnosis not present

## 2016-02-22 MED ORDER — ESTROGENS, CONJUGATED 0.625 MG/GM VA CREA: 1.0000 | TOPICAL_CREAM | VAGINAL | 4 refills | Status: DC

## 2016-02-22 NOTE — Progress Notes (Signed)
Encounter reviewed Jill Jertson, MD   

## 2016-02-22 NOTE — Patient Instructions (Signed)
EXERCISE AND DIET:  We recommended that you start or continue a regular exercise program for good health. Regular exercise means any activity that makes your heart beat faster and makes you sweat.  We recommend exercising at least 30 minutes per day at least 3 days a week, preferably 4 or 5.  We also recommend a diet low in fat and sugar.  Inactivity, poor dietary choices and obesity can cause diabetes, heart attack, stroke, and kidney damage, among others.    ALCOHOL AND SMOKING:  Women should limit their alcohol intake to no more than 7 drinks/beers/glasses of wine (combined, not each!) per week. Moderation of alcohol intake to this level decreases your risk of breast cancer and liver damage. And of course, no recreational drugs are part of a healthy lifestyle.  And absolutely no smoking or even second hand smoke. Most people know smoking can cause heart and lung diseases, but did you know it also contributes to weakening of your bones? Aging of your skin?  Yellowing of your teeth and nails?  CALCIUM AND VITAMIN D:  Adequate intake of calcium and Vitamin D are recommended.  The recommendations for exact amounts of these supplements seem to change often, but generally speaking 600 mg of calcium (either carbonate or citrate) and 800 units of Vitamin D per day seems prudent. Certain women may benefit from higher intake of Vitamin D.  If you are among these women, your doctor will have told you during your visit.    PAP SMEARS:  Pap smears, to check for cervical cancer or precancers,  have traditionally been done yearly, although recent scientific advances have shown that most women can have pap smears less often.  However, every woman still should have a physical exam from her gynecologist every year. It will include a breast check, inspection of the vulva and vagina to check for abnormal growths or skin changes, a visual exam of the cervix, and then an exam to evaluate the size and shape of the uterus and  ovaries.  And after 57 years of age, a rectal exam is indicated to check for rectal cancers. We will also provide age appropriate advice regarding health maintenance, like when you should have certain vaccines, screening for sexually transmitted diseases, bone density testing, colonoscopy, mammograms, etc.   MAMMOGRAMS:  All women over 40 years old should have a yearly mammogram. Many facilities now offer a "3D" mammogram, which may cost around $50 extra out of pocket. If possible,  we recommend you accept the option to have the 3D mammogram performed.  It both reduces the number of women who will be called back for extra views which then turn out to be normal, and it is better than the routine mammogram at detecting truly abnormal areas.    COLONOSCOPY:  Colonoscopy to screen for colon cancer is recommended for all women at age 50.  We know, you hate the idea of the prep.  We agree, BUT, having colon cancer and not knowing it is worse!!  Colon cancer so often starts as a polyp that can be seen and removed at colonscopy, which can quite literally save your life!  And if your first colonoscopy is normal and you have no family history of colon cancer, most women don't have to have it again for 10 years.  Once every ten years, you can do something that may end up saving your life, right?  We will be happy to help you get it scheduled when you are ready.    Be sure to check your insurance coverage so you understand how much it will cost.  It may be covered as a preventative service at no cost, but you should check your particular policy.      Cirrhosis Cirrhosis is long-term (chronic) liver injury. The liver is your largest internal organ, and it performs many functions. The liver converts food into energy, removes toxic material from your blood, makes important proteins, and absorbs necessary vitamins from your diet. If you have cirrhosis, it means many of your healthy liver cells have been replaced by scar  tissue. This prevents blood from flowing through your liver, which makes it difficult for your liver to function. This scarring is not reversible, but treatment can prevent it from getting worse. What are the causes? Hepatitis C and long-term alcohol abuse are the most common causes of cirrhosis. Other causes include:  Nonalcoholic fatty liver disease.  Hepatitis B infection.  Autoimmune hepatitis.  Diseases that cause blockage of ducts inside the liver.  Inherited liver diseases.  Reactions to certain long-term medicines.  Parasitic infections.  Long-term exposure to certain toxins. What increases the risk? You may have a higher risk of cirrhosis if you:  Have certain hepatitis viruses.  Abuse alcohol, especially if you are female.  Are overweight.  Share needles.  Have unprotected sex with someone who has hepatitis. What are the signs or symptoms? You may not have any signs and symptoms at first. Symptoms may not develop until the damage to your liver starts to get worse. Signs and symptoms of cirrhosis may include:  Tenderness in the right-upper part of your abdomen.  Weakness and tiredness (fatigue).  Loss of appetite.  Nausea.  Weight loss and muscle loss.  Itchiness.  Yellow skin and eyes (jaundice).  Buildup of fluid in the abdomen (ascites).  Swelling of the feet and ankles (edema).  Appearance of tiny blood vessels under the skin.  Mental confusion.  Easy bruising and bleeding. How is this diagnosed? Your health care provider may suspect cirrhosis based on your symptoms and medical history, especially if you have other medical conditions or a history of alcohol abuse. Your health care provider will do a physical exam to feel your liver and check for signs of cirrhosis. Your health care provider may perform other tests, including:  Blood tests to check:  Whether you have hepatitis B or C.  Kidney function.  Liver function.  Imaging tests  such as:  MRI or CT scan to look for changes seen in advanced cirrhosis.  Ultrasound to see if normal liver tissue is being replaced by scar tissue.  A procedure using a long needle to take a sample of liver tissue (biopsy) for examination under a microscope. Liver biopsy can confirm the diagnosis of cirrhosis. How is this treated? Treatment depends on how damaged your liver is and what caused the damage. Treatment may include treating cirrhosis symptoms or treating the underlying causes of the condition to try to slow the progression of the damage. Treatment may include:  Making lifestyle changes, such as:  Eating a healthy diet.  Restricting salt intake.  Maintaining a healthy weight.  Not abusing drugs or alcohol.  Taking medicines to:  Treat liver infections or other infections.  Control itching.  Reduce fluid buildup.  Reduce certain blood toxins.  Reduce risk of bleeding from enlarged blood vessels in the stomach or esophagus (varices).  If varices are causing bleeding problems, you may need treatment with a procedure that ties up the  vessels causing them to fall off (band ligation).  If cirrhosis is causing your liver to fail, your health care provider may recommend a liver transplant.  Other treatments may be recommended depending on any complications of cirrhosis, such as liver-related kidney failure (hepatorenal syndrome). Follow these instructions at home:  Take medicines only as directed by your health care provider. Do not use drugs that are toxic to your liver. Ask your health care provider before taking any new medicines, including over-the-counter medicines.  Rest as needed.  Eat a well-balanced diet. Ask your health care provider or dietitian for more information.  You may have to follow a low-salt diet or restrict your water intake as directed.  Do not drink alcohol. This is especially important if you are taking acetaminophen.  Keep all follow-up  visits as directed by your health care provider. This is important. Contact a health care provider if:  You have fatigue or weakness that is getting worse.  You develop swelling of the hands, feet, legs, or face.  You have a fever.  You develop loss of appetite.  You have nausea or vomiting.  You develop jaundice.  You develop easy bruising or bleeding. Get help right away if:  You vomit bright red blood or a material that looks like coffee grounds.  You have blood in your stools.  Your stools appear black and tarry.  You become confused.  You have chest pain or trouble breathing. This information is not intended to replace advice given to you by your health care provider. Make sure you discuss any questions you have with your health care provider. Document Released: 03/11/2005 Document Revised: 07/20/2015 Document Reviewed: 11/17/2013 Elsevier Interactive Patient Education  2017 [employer].

## 2016-02-22 NOTE — Progress Notes (Signed)
55 y.o. G1P1001 Married  African American Fe here for annual exam. Occasional hot flashes and night sweats. Vaginal dryness so much better with Premarin cream. Desires continuance. Sees PCP for aex, labs and Thyroid management .Has appointment soon. No health issues today. Planning trip next year.  No LMP recorded. Patient has had a hysterectomy.          Sexually active: Yes.    The current method of family planning is status post hysterectomy.    Exercising: Yes.    walking & running Smoker:  no  Health Maintenance: Pap:  03-22-10 neg MMG:  02-03-16 category a density birads 1:neg Colonoscopy:  none BMD:   2016 managed by pcp TDaP:  2012 Shingles: 2013 Pneumonia: no Hep C and HIV: not done Labs: poct urine-neg Self breast exam: done daily   reports that she has never smoked. She has never used smokeless tobacco. She reports that she does not drink alcohol or use drugs.  Past Medical History:  Diagnosis Date  . Accidental fall    on or from other stairs or steps  . Allergic rhinitis, cause unspecified   . Cervicalgia   . Chest pain, unspecified   . Contusion of face, scalp, and neck except eye(s)   . Fibroid   . Hypertension 2011  . Hypothyroidism 2011  . Leg pain   . Low back pain   . 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 Pain in limb   . Rash and other nonspecific skin eruption   . Unspecified pruritic disorder   . Unspecified vitamin D deficiency     Past Surgical History:  Procedure Laterality Date  . ABDOMINAL HYSTERECTOMY  1999   TAH, ovaries retained    Current Outpatient Prescriptions  Medication Sig Dispense Refill  . BIOTIN PO Take by mouth daily.    . Cholecalciferol (VITAMIN D PO) Take 1,000 Int'l Units by mouth daily.    Marland Kitchen conjugated estrogens (PREMARIN) vaginal cream Place 1 Applicatorful vaginally 2 (two) times a week. 42.5 g 4  . levothyroxine (SYNTHROID, LEVOTHROID) 50 MCG tablet Take 1 tablet (50 mcg total) by  mouth daily before breakfast. 90 tablet 3  . losartan (COZAAR) 50 MG tablet Take 1 tablet (50 mg total) by mouth daily. 90 tablet 3  . Multiple Vitamins-Minerals (MULTIVITAMIN PO) Take by mouth daily.    . Omega-3 Fatty Acids (FISH OIL PO) Take by mouth daily.     No current facility-administered medications for this visit.     Family History  Problem Relation Age of Onset  . Coronary artery disease Neg Hx   . Thyroid disease Neg Hx   . Early death Mother   . Diabetes Sister   . Diabetes Paternal Uncle     ROS:  Pertinent items are noted in HPI.  Otherwise, a comprehensive ROS was negative.  Exam:   BP 122/80   Pulse 68   Resp 16   Ht 5' 2.25" (1.581 m)   Wt 193 lb (87.5 kg)   BMI 35.02 kg/m  Height: 5' 2.25" (158.1 cm) Ht Readings from Last 3 Encounters:  02/22/16 5' 2.25" (1.581 m)  12/01/14 5' 2.25" (1.581 m)  06/30/13 5' 2.5" (1.588 m)    General appearance: alert, cooperative and appears stated age Head: Normocephalic, without obvious abnormality, atraumatic Neck: no adenopathy, supple, symmetrical, trachea midline and thyroid normal to inspection and palpation Lungs: clear to auscultation bilaterally Breasts: normal appearance, no masses or tenderness, No  nipple retraction or dimpling, No nipple discharge or bleeding, No axillary or supraclavicular adenopathy Heart: regular rate and rhythm Abdomen: soft, non-tender; no masses,  no organomegaly Extremities: extremities normal, atraumatic, no cyanosis or edema Skin: Skin color, texture, turgor normal. No rashes or lesions Lymph nodes: Cervical, supraclavicular, and axillary nodes normal. No abnormal inguinal nodes palpated Neurologic: Grossly normal   Pelvic: External genitalia:  no lesions              Urethra:  normal appearing urethra with no masses, tenderness or lesions              Bartholin's and Skene's: normal                 Vagina: normal appearing vagina with normal color and discharge, no lesions               Cervix: absent              Pap taken: No. Bimanual Exam:  Uterus:  uterus absent              Adnexa: normal adnexa and no mass, fullness, tenderness               Rectovaginal: Confirms               Anus:  normal sphincter tone, no lesions  Chaperone present: yes  A:  Well Woman with normal exam  Menopausal no HRT S/P TAH for fibroids and bleeding  Atrophic Vaginitis with Premarin use with good result  Hypothyroid with PCP management  P:   Reviewed health and wellness pertinent to exam  Discussed risks and benefits of Premarin cream desires continuance.  Rx Premarin see order with instructions  Continue follow up as indicated.  Pap smear as above not taken   counseled on breast self exam, mammography screening, adequate intake of calcium and vitamin D, diet and exercise  return annually or prn  An After Visit Summary was printed and given to the patient.

## 2016-03-01 ENCOUNTER — Ambulatory Visit: Payer: BLUE CROSS/BLUE SHIELD | Admitting: Internal Medicine

## 2016-10-25 ENCOUNTER — Other Ambulatory Visit: Payer: Self-pay | Admitting: Internal Medicine

## 2016-10-27 ENCOUNTER — Encounter: Payer: Self-pay | Admitting: Internal Medicine

## 2016-10-30 MED ORDER — LEVOTHYROXINE SODIUM 50 MCG PO TABS: 50.0000 ug | ORAL_TABLET | Freq: Every day | ORAL | 3 refills | Status: DC

## 2017-02-25 ENCOUNTER — Ambulatory Visit: Payer: BLUE CROSS/BLUE SHIELD | Admitting: Certified Nurse Midwife

## 2017-04-17 ENCOUNTER — Ambulatory Visit: Payer: BLUE CROSS/BLUE SHIELD | Admitting: Certified Nurse Midwife

## 2017-04-30 ENCOUNTER — Encounter: Payer: Self-pay | Admitting: Internal Medicine

## 2017-05-04 ENCOUNTER — Other Ambulatory Visit: Payer: Self-pay | Admitting: Internal Medicine

## 2017-05-04 MED ORDER — SYNTHROID 50 MCG PO TABS: 50.0000 ug | ORAL_TABLET | Freq: Every day | ORAL | 3 refills | Status: DC

## 2017-05-05 ENCOUNTER — Other Ambulatory Visit: Payer: Self-pay | Admitting: Internal Medicine

## 2017-05-05 MED ORDER — LEVOTHYROXINE SODIUM 50 MCG PO TABS: 50.0000 ug | ORAL_TABLET | Freq: Every day | ORAL | 3 refills | Status: DC

## 2017-09-04 ENCOUNTER — Encounter: Payer: Self-pay | Admitting: Internal Medicine

## 2017-09-04 NOTE — Telephone Encounter (Signed)
Patient called and needs a refill of her losartan (COZAAR) 50 MG tablet , Patient states the pharmacy sent a request last week.  [facility] [place], [address] [place] [phone] (Phone) [phone] (Fax)

## 2017-09-04 NOTE — Telephone Encounter (Signed)
Patient scheduled to see Dr Alain Marion at his first available. She does not want to see anyone else before. Can this medication be refilled to get her through until the visit?

## 2017-09-04 NOTE — Telephone Encounter (Signed)
Per chart she has not seen MD since 02/22/16, and will need OV for refills on her BP med. Pls schedule appt.Marland KitchenJohny Chess

## 2017-09-05 MED ORDER — LOSARTAN POTASSIUM 50 MG PO TABS: 50.0000 mg | ORAL_TABLET | Freq: Every day | ORAL | 0 refills | Status: DC

## 2017-09-24 ENCOUNTER — Ambulatory Visit: Payer: Self-pay | Admitting: Internal Medicine

## 2017-09-24 ENCOUNTER — Other Ambulatory Visit (INDEPENDENT_AMBULATORY_CARE_PROVIDER_SITE_OTHER): Payer: Self-pay

## 2017-09-24 ENCOUNTER — Encounter: Payer: Self-pay | Admitting: Internal Medicine

## 2017-09-24 VITALS — BP 150/90 | HR 74 | Ht 62.25 in | Wt 197.0 lb

## 2017-09-24 DIAGNOSIS — E034 Atrophy of thyroid (acquired): Secondary | ICD-10-CM

## 2017-09-24 DIAGNOSIS — E538 Deficiency of other specified B group vitamins: Secondary | ICD-10-CM

## 2017-09-24 DIAGNOSIS — Z1211 Encounter for screening for malignant neoplasm of colon: Secondary | ICD-10-CM

## 2017-09-24 DIAGNOSIS — E559 Vitamin D deficiency, unspecified: Secondary | ICD-10-CM

## 2017-09-24 DIAGNOSIS — Z0000 Encounter for general adult medical examination without abnormal findings: Secondary | ICD-10-CM

## 2017-09-24 DIAGNOSIS — E042 Nontoxic multinodular goiter: Secondary | ICD-10-CM

## 2017-09-24 LAB — CBC WITH DIFFERENTIAL/PLATELET
BASOS ABS: 0 10*3/uL (ref 0.0–0.1)
Basophils Relative: 0.9 % (ref 0.0–3.0)
EOS PCT: 2.8 % (ref 0.0–5.0)
Eosinophils Absolute: 0.1 10*3/uL (ref 0.0–0.7)
HCT: 40.4 % (ref 36.0–46.0)
HEMOGLOBIN: 13.7 g/dL (ref 12.0–15.0)
Lymphocytes Relative: 30.7 % (ref 12.0–46.0)
Lymphs Abs: 1.5 10*3/uL (ref 0.7–4.0)
MCHC: 34 g/dL (ref 30.0–36.0)
MCV: 85.9 fl (ref 78.0–100.0)
MONO ABS: 0.5 10*3/uL (ref 0.1–1.0)
Monocytes Relative: 9 % (ref 3.0–12.0)
Neutro Abs: 2.8 10*3/uL (ref 1.4–7.7)
Neutrophils Relative %: 56.6 % (ref 43.0–77.0)
Platelets: 304 10*3/uL (ref 150.0–400.0)
RBC: 4.71 Mil/uL (ref 3.87–5.11)
RDW: 14.2 % (ref 11.5–15.5)
WBC: 5 10*3/uL (ref 4.0–10.5)

## 2017-09-24 LAB — VITAMIN B12: Vitamin B-12: 591 pg/mL (ref 211–911)

## 2017-09-24 LAB — URINALYSIS
Bilirubin Urine: NEGATIVE
Ketones, ur: NEGATIVE
Leukocytes, UA: NEGATIVE
NITRITE: NEGATIVE
Specific Gravity, Urine: 1.015 (ref 1.000–1.030)
Total Protein, Urine: NEGATIVE
UROBILINOGEN UA: 0.2 (ref 0.0–1.0)
Urine Glucose: NEGATIVE
pH: 7 (ref 5.0–8.0)

## 2017-09-24 LAB — LIPID PANEL
Cholesterol: 206 mg/dL — ABNORMAL HIGH (ref 0–200)
HDL: 64.7 mg/dL (ref >39.00)
LDL Cholesterol: 128 mg/dL — ABNORMAL HIGH (ref 0–99)
NONHDL: 141.38
Total CHOL/HDL Ratio: 3
Triglycerides: 69 mg/dL (ref 0.0–149.0)
VLDL: 13.8 mg/dL (ref 0.0–40.0)

## 2017-09-24 LAB — HEPATIC FUNCTION PANEL
ALBUMIN: 4.3 g/dL (ref 3.5–5.2)
ALT: 10 U/L (ref 0–35)
AST: 14 U/L (ref 0–37)
Alkaline Phosphatase: 79 U/L (ref 39–117)
Bilirubin, Direct: 0.1 mg/dL (ref 0.0–0.3)
TOTAL PROTEIN: 6.8 g/dL (ref 6.0–8.3)
Total Bilirubin: 0.6 mg/dL (ref 0.2–1.2)

## 2017-09-24 LAB — PSA: PSA: 0 ng/mL — AB (ref 0.10–4.00)

## 2017-09-24 LAB — BASIC METABOLIC PANEL
BUN: 12 mg/dL (ref 6–23)
CHLORIDE: 107 meq/L (ref 96–112)
CO2: 28 mEq/L (ref 19–32)
Calcium: 9.4 mg/dL (ref 8.4–10.5)
Creatinine, Ser: 0.73 mg/dL (ref 0.40–1.20)
GFR: 104.92 mL/min (ref >60.00)
Glucose, Bld: 96 mg/dL (ref 70–99)
POTASSIUM: 4.1 meq/L (ref 3.5–5.1)
Sodium: 143 mEq/L (ref 135–145)

## 2017-09-24 LAB — TSH: TSH: 21.41 u[IU]/mL — AB (ref 0.35–4.50)

## 2017-09-24 LAB — VITAMIN D 25 HYDROXY (VIT D DEFICIENCY, FRACTURES): VITD: 37.69 ng/mL (ref 30.00–100.00)

## 2017-09-24 MED ORDER — LEVOTHYROXINE SODIUM 50 MCG PO TABS: 50.0000 ug | ORAL_TABLET | Freq: Every day | ORAL | 3 refills | Status: DC

## 2017-09-24 MED ORDER — LOSARTAN POTASSIUM 100 MG PO TABS: 100.0000 mg | ORAL_TABLET | Freq: Every day | ORAL | 3 refills | Status: DC

## 2017-09-24 NOTE — Assessment & Plan Note (Signed)
Levothroid 

## 2017-09-24 NOTE — Assessment & Plan Note (Signed)
Vit D 

## 2017-09-24 NOTE — Progress Notes (Signed)
Subjective:  Patient ID: Krystal Gibbs, female    DOB: Aug 16, 1960  Age: 57 y.o. MRN: [id number]  CC: No chief complaint on file.   HPI Krystal Gibbs presents for a well exam F/u HTN BP 130/85 at home  Outpatient Medications Prior to Visit  Medication Sig Dispense Refill  . BIOTIN PO Take by mouth daily.    . Cholecalciferol (VITAMIN D PO) Take 1,000 Int'l Units by mouth daily.    Marland Kitchen conjugated estrogens (PREMARIN) vaginal cream Place 1 Applicatorful vaginally 2 (two) times a week. 42.5 g 4  . levothyroxine (SYNTHROID, LEVOTHROID) 50 MCG tablet Take 1 tablet (50 mcg total) by mouth daily before breakfast. 90 tablet 3  . losartan (COZAAR) 50 MG tablet Take 1 tablet (50 mg total) by mouth daily. Must keep scheduled appt for future refills 30 tablet 0  . Multiple Vitamins-Minerals (MULTIVITAMIN PO) Take by mouth daily.    . Omega-3 Fatty Acids (FISH OIL PO) Take by mouth daily.    Marland Kitchen SYNTHROID 50 MCG tablet Take 1 tablet (50 mcg total) by mouth daily. 90 tablet 3   No facility-administered medications prior to visit.     ROS: Review of Systems  Constitutional: Negative for activity change, appetite change, chills, fatigue and unexpected weight change.  HENT: Negative for congestion, mouth sores and sinus pressure.   Eyes: Negative for visual disturbance.  Respiratory: Negative for cough and chest tightness.   Gastrointestinal: Negative for abdominal pain and nausea.  Genitourinary: Negative for difficulty urinating, frequency and vaginal pain.  Musculoskeletal: Negative for back pain and gait problem.  Skin: Negative for pallor and rash.  Neurological: Negative for dizziness, tremors, weakness, numbness and headaches.  Psychiatric/Behavioral: Negative for confusion and sleep disturbance.    Objective:  BP (!) 150/90 (BP Location: Left Arm, Patient Position: Sitting, Cuff Size: Large)   Pulse 74   Ht 5' 2.25" (1.581 m)   Wt 197 lb (89.4 kg)   SpO2 98%   BMI 35.74 kg/m     BP Readings from Last 3 Encounters:  09/24/17 (!) 150/90  02/22/16 122/80  08/04/15 138/88    Wt Readings from Last 3 Encounters:  09/24/17 197 lb (89.4 kg)  02/22/16 193 lb (87.5 kg)  08/04/15 198 lb (89.8 kg)    Physical Exam  Constitutional: She appears well-developed. No distress.  HENT:  Head: Normocephalic.  Right Ear: External ear normal.  Left Ear: External ear normal.  Nose: Nose normal.  Mouth/Throat: Oropharynx is clear and moist.  Eyes: Pupils are equal, round, and reactive to light. Conjunctivae are normal. Right eye exhibits no discharge. Left eye exhibits no discharge.  Neck: Normal range of motion. Neck supple. No JVD present. No tracheal deviation present. No thyromegaly present.  Cardiovascular: Normal rate, regular rhythm and normal heart sounds.  Pulmonary/Chest: No stridor. No respiratory distress. She has no wheezes.  Abdominal: Soft. Bowel sounds are normal. She exhibits no distension and no mass. There is no tenderness. There is no rebound and no guarding.  Musculoskeletal: She exhibits no edema or tenderness.  Lymphadenopathy:    She has no cervical adenopathy.  Neurological: She displays normal reflexes. No cranial nerve deficit. She exhibits normal muscle tone. Coordination normal.  Skin: No rash noted. No erythema.  Psychiatric: She has a normal mood and affect. Her behavior is normal. Judgment and thought content normal.  R thyroid nodule  Lab Results  Component Value Date   WBC 5.8 07/29/2014   HGB 13.6 07/29/2014   HCT  40.1 07/29/2014   PLT 322.0 07/29/2014   GLUCOSE 93 08/04/2015   CHOL 196 07/29/2014   TRIG 129.0 07/29/2014   HDL 63.40 07/29/2014   LDLDIRECT 133.5 04/28/2013   LDLCALC 107 (H) 07/29/2014   ALT 8 07/29/2014   AST 13 07/29/2014   NA 143 08/04/2015   K 3.9 08/04/2015   CL 108 08/04/2015   CREATININE 0.72 08/04/2015   BUN 9 08/04/2015   CO2 26 08/04/2015   TSH 12.39 (H) 08/04/2015    No results  found.  Assessment & Plan:   There are no diagnoses linked to this encounter.   No orders of the defined types were placed in this encounter.    Follow-up: No follow-ups on file.  Walker Kehr, MD

## 2017-09-24 NOTE — Assessment & Plan Note (Signed)
We discussed age appropriate health related issues, including available/recomended screening tests and vaccinations. We discussed a need for adhering to healthy diet and exercise. Labs/EKG were reviewed/ordered. All questions were answered. Cologuard

## 2017-09-24 NOTE — Assessment & Plan Note (Signed)
On B12 

## 2017-09-28 ENCOUNTER — Other Ambulatory Visit: Payer: Self-pay | Admitting: Internal Medicine

## 2017-09-28 DIAGNOSIS — E034 Atrophy of thyroid (acquired): Secondary | ICD-10-CM

## 2017-09-28 MED ORDER — LEVOTHYROXINE SODIUM 100 MCG PO TABS: 100.0000 ug | ORAL_TABLET | Freq: Every day | ORAL | 3 refills | Status: DC

## 2017-10-20 ENCOUNTER — Encounter: Payer: Self-pay | Admitting: Internal Medicine

## 2017-10-28 ENCOUNTER — Encounter: Payer: Self-pay | Admitting: Internal Medicine

## 2017-10-31 ENCOUNTER — Ambulatory Visit
Admission: RE | Admit: 2017-10-31 | Discharge: 2017-10-31 | Disposition: A | Discharge status: Home / Self Care | Payer: Self-pay | Source: Ambulatory Visit | Attending: Internal Medicine | Admitting: Internal Medicine

## 2017-12-29 ENCOUNTER — Encounter: Payer: Self-pay | Admitting: Internal Medicine

## 2018-02-16 ENCOUNTER — Other Ambulatory Visit (INDEPENDENT_AMBULATORY_CARE_PROVIDER_SITE_OTHER): Payer: Self-pay

## 2018-02-16 DIAGNOSIS — E034 Atrophy of thyroid (acquired): Secondary | ICD-10-CM

## 2018-02-16 LAB — TSH: TSH: 0.83 u[IU]/mL (ref 0.35–4.50)

## 2018-02-23 ENCOUNTER — Ambulatory Visit (INDEPENDENT_AMBULATORY_CARE_PROVIDER_SITE_OTHER): Payer: Self-pay | Admitting: Internal Medicine

## 2018-02-23 ENCOUNTER — Encounter: Payer: Self-pay | Admitting: Internal Medicine

## 2018-02-23 VITALS — BP 132/88 | HR 89 | Temp 98.2°F | Ht 62.25 in | Wt 206.0 lb

## 2018-02-23 DIAGNOSIS — E034 Atrophy of thyroid (acquired): Secondary | ICD-10-CM

## 2018-02-23 DIAGNOSIS — I10 Essential (primary) hypertension: Secondary | ICD-10-CM

## 2018-02-23 DIAGNOSIS — Z0000 Encounter for general adult medical examination without abnormal findings: Secondary | ICD-10-CM

## 2018-02-23 DIAGNOSIS — E538 Deficiency of other specified B group vitamins: Secondary | ICD-10-CM

## 2018-02-23 DIAGNOSIS — R635 Abnormal weight gain: Secondary | ICD-10-CM | POA: Insufficient documentation

## 2018-02-23 NOTE — Progress Notes (Signed)
Subjective:  Patient ID: Krystal Gibbs, female    DOB: 02-26-1961  Age: 57 y.o. MRN: [id number]  CC: No chief complaint on file.   HPI Krystal Gibbs presents for hypothyroidism, menopause, HTN C/o wt gain  Outpatient Medications Prior to Visit  Medication Sig Dispense Refill  . BIOTIN PO Take by mouth daily.    . Cholecalciferol (VITAMIN D PO) Take 1,000 Int'l Units by mouth daily.    Marland Kitchen conjugated estrogens (PREMARIN) vaginal cream Place 1 Applicatorful vaginally 2 (two) times a week. 42.5 g 4  . levothyroxine (SYNTHROID, LEVOTHROID) 100 MCG tablet Take 1 tablet (100 mcg total) by mouth daily. 90 tablet 3  . losartan (COZAAR) 100 MG tablet Take 1 tablet (100 mg total) by mouth daily. 90 tablet 3  . Multiple Vitamins-Minerals (MULTIVITAMIN PO) Take by mouth daily.    . Omega-3 Fatty Acids (FISH OIL PO) Take by mouth daily.     No facility-administered medications prior to visit.     ROS: Review of Systems  Constitutional: Positive for unexpected weight change. Negative for activity change, appetite change, chills and fatigue.  HENT: Negative for congestion, mouth sores and sinus pressure.   Eyes: Negative for visual disturbance.  Respiratory: Negative for cough and chest tightness.   Gastrointestinal: Negative for abdominal pain and nausea.  Genitourinary: Negative for difficulty urinating, frequency and vaginal pain.  Musculoskeletal: Positive for arthralgias. Negative for back pain and gait problem.  Skin: Negative for pallor and rash.  Neurological: Negative for dizziness, tremors, weakness, numbness and headaches.  Psychiatric/Behavioral: Negative for confusion, sleep disturbance and suicidal ideas.    Objective:  BP 132/88 (BP Location: Left Arm, Patient Position: Sitting, Cuff Size: Large)   Pulse 89   Temp 98.2 F (36.8 C) (Oral)   Ht 5' 2.25" (1.581 m)   Wt 206 lb (93.4 kg)   SpO2 97%   BMI 37.38 kg/m   BP Readings from Last 3 Encounters:  02/23/18  132/88  09/24/17 (!) 150/90  02/22/16 122/80    Wt Readings from Last 3 Encounters:  02/23/18 206 lb (93.4 kg)  09/24/17 197 lb (89.4 kg)  02/22/16 193 lb (87.5 kg)    Physical Exam  Constitutional: She appears well-developed. No distress.  HENT:  Head: Normocephalic.  Right Ear: External ear normal.  Left Ear: External ear normal.  Nose: Nose normal.  Mouth/Throat: Oropharynx is clear and moist.  Eyes: Pupils are equal, round, and reactive to light. Conjunctivae are normal. Right eye exhibits no discharge. Left eye exhibits no discharge.  Neck: Normal range of motion. Neck supple. No JVD present. No tracheal deviation present. No thyromegaly present.  Cardiovascular: Normal rate, regular rhythm and normal heart sounds.  Pulmonary/Chest: No stridor. No respiratory distress. She has no wheezes.  Abdominal: Soft. Bowel sounds are normal. She exhibits no distension and no mass. There is no tenderness. There is no rebound and no guarding.  Musculoskeletal: She exhibits no edema or tenderness.  Lymphadenopathy:    She has no cervical adenopathy.  Neurological: She displays normal reflexes. No cranial nerve deficit. She exhibits normal muscle tone. Coordination normal.  Skin: No rash noted. No erythema.  Psychiatric: She has a normal mood and affect. Her behavior is normal. Judgment and thought content normal.  goiter obese  Lab Results  Component Value Date   WBC 5.0 09/24/2017   HGB 13.7 09/24/2017   HCT 40.4 09/24/2017   PLT 304.0 09/24/2017   GLUCOSE 96 09/24/2017   CHOL 206 (H) 09/24/2017  TRIG 69.0 09/24/2017   HDL 64.70 09/24/2017   LDLDIRECT 133.5 04/28/2013   LDLCALC 128 (H) 09/24/2017   ALT 10 09/24/2017   AST 14 09/24/2017   NA 143 09/24/2017   K 4.1 09/24/2017   CL 107 09/24/2017   CREATININE 0.73 09/24/2017   BUN 12 09/24/2017   CO2 28 09/24/2017   TSH 0.83 02/16/2018   PSA 0.00 (L) 09/24/2017    US Thyroid  Result Date: 10/31/2017 CLINICAL DATA:   Goiter.  Nontoxic multinodular goiter. EXAM: THYROID ULTRASOUND TECHNIQUE: Ultrasound examination of the thyroid gland and adjacent soft tissues was performed. COMPARISON:  02/17/2008 FINDINGS: Parenchymal Echotexture: Moderately heterogenous Isthmus: 0.6 cm, previously 1.7 cm Right lobe: 1.4 x 0.6 x 0.9 cm, previously 5.1 x 1.6 x 2.0 cm Left lobe: 2.1 x 0.6 x 0.7 cm, previously 7.6 x 2.3 x 2.4 cm _________________________________________________________ Estimated total number of nodules >/= 1 cm: 1 Number of spongiform nodules >/=  2 cm not described below (TR1): 0 Number of mixed cystic and solid nodules >/= 1.5 cm not described below (TR2): 0 _________________________________________________________ Nodule 1 in the isthmus measures 3.0 x 1.6 x 2.7 cm and previously measured 3.9 x 2.0 x 4.5 cm. Biopsy was performed 04/17/2005. Small left lower pole nodule 2 measures 0.9 cm and does not meet criteria for biopsy nor follow-up. It previously measured up to 3.9 cm. Biopsy was performed 06/17/2008 IMPRESSION: Nodules 1 and 2 are smaller.  Both have been previously biopsied. The above is in keeping with the ACR TI-RADS recommendations - J Am Coll Radiol 2017;14:587-595. Electronically Signed   By: Marybelle Killings M.D.   On: 10/31/2017 15:06    Assessment & Plan:   There are no diagnoses linked to this encounter.   No orders of the defined types were placed in this encounter.    Follow-up: No follow-ups on file.  Walker Kehr, MD

## 2018-02-23 NOTE — Assessment & Plan Note (Signed)
B 12

## 2018-02-23 NOTE — Patient Instructions (Signed)

## 2018-02-23 NOTE — Assessment & Plan Note (Signed)
diet discussed

## 2018-02-23 NOTE — Assessment & Plan Note (Addendum)
Losartan CT ca scoring test offered

## 2018-02-23 NOTE — Assessment & Plan Note (Signed)
On Levothroid TSH nl

## 2018-09-07 ENCOUNTER — Ambulatory Visit: Payer: No Typology Code available for payment source | Admitting: Internal Medicine

## 2018-09-08 ENCOUNTER — Other Ambulatory Visit: Payer: Self-pay | Admitting: Internal Medicine

## 2018-09-08 MED ORDER — ALPRAZOLAM 0.25 MG PO TABS: 0.2500 mg | ORAL_TABLET | Freq: Two times a day (BID) | ORAL | 0 refills | Status: DC | PRN

## 2018-09-08 NOTE — Progress Notes (Signed)
Alprazolam prn

## 2018-09-11 ENCOUNTER — Other Ambulatory Visit: Payer: Self-pay | Admitting: Internal Medicine

## 2018-09-17 ENCOUNTER — Ambulatory Visit (INDEPENDENT_AMBULATORY_CARE_PROVIDER_SITE_OTHER): Payer: Self-pay | Admitting: Internal Medicine

## 2018-09-17 ENCOUNTER — Other Ambulatory Visit (INDEPENDENT_AMBULATORY_CARE_PROVIDER_SITE_OTHER): Payer: Self-pay

## 2018-09-17 ENCOUNTER — Encounter: Payer: Self-pay | Admitting: Internal Medicine

## 2018-09-17 ENCOUNTER — Other Ambulatory Visit: Payer: Self-pay

## 2018-09-17 DIAGNOSIS — E559 Vitamin D deficiency, unspecified: Secondary | ICD-10-CM

## 2018-09-17 DIAGNOSIS — E538 Deficiency of other specified B group vitamins: Secondary | ICD-10-CM

## 2018-09-17 DIAGNOSIS — R635 Abnormal weight gain: Secondary | ICD-10-CM

## 2018-09-17 DIAGNOSIS — I10 Essential (primary) hypertension: Secondary | ICD-10-CM

## 2018-09-17 DIAGNOSIS — E034 Atrophy of thyroid (acquired): Secondary | ICD-10-CM

## 2018-09-17 DIAGNOSIS — F341 Dysthymic disorder: Secondary | ICD-10-CM

## 2018-09-17 LAB — HEPATIC FUNCTION PANEL
ALT: 12 U/L (ref 0–35)
AST: 13 U/L (ref 0–37)
Albumin: 4.2 g/dL (ref 3.5–5.2)
Alkaline Phosphatase: 90 U/L (ref 39–117)
Bilirubin, Direct: 0.1 mg/dL (ref 0.0–0.3)
Total Bilirubin: 0.4 mg/dL (ref 0.2–1.2)
Total Protein: 7.2 g/dL (ref 6.0–8.3)

## 2018-09-17 LAB — CBC WITH DIFFERENTIAL/PLATELET
Basophils Absolute: 0 10*3/uL (ref 0.0–0.1)
Basophils Relative: 0.6 % (ref 0.0–3.0)
Eosinophils Absolute: 0.2 10*3/uL (ref 0.0–0.7)
Eosinophils Relative: 2.8 % (ref 0.0–5.0)
HCT: 40.1 % (ref 36.0–46.0)
Hemoglobin: 13.3 g/dL (ref 12.0–15.0)
Lymphocytes Relative: 35.1 % (ref 12.0–46.0)
Lymphs Abs: 1.9 10*3/uL (ref 0.7–4.0)
MCHC: 33.1 g/dL (ref 30.0–36.0)
MCV: 86 fl (ref 78.0–100.0)
Monocytes Absolute: 0.5 10*3/uL (ref 0.1–1.0)
Monocytes Relative: 9.8 % (ref 3.0–12.0)
Neutro Abs: 2.8 10*3/uL (ref 1.4–7.7)
Neutrophils Relative %: 51.7 % (ref 43.0–77.0)
Platelets: 376 10*3/uL (ref 150.0–400.0)
RBC: 4.66 Mil/uL (ref 3.87–5.11)
RDW: 14 % (ref 11.5–15.5)
WBC: 5.5 10*3/uL (ref 4.0–10.5)

## 2018-09-17 LAB — BASIC METABOLIC PANEL
BUN: 16 mg/dL (ref 6–23)
CO2: 26 mEq/L (ref 19–32)
Calcium: 9.3 mg/dL (ref 8.4–10.5)
Chloride: 109 mEq/L (ref 96–112)
Creatinine, Ser: 0.68 mg/dL (ref 0.40–1.20)
GFR: 106.78 mL/min (ref >60.00)
Glucose, Bld: 91 mg/dL (ref 70–99)
Potassium: 4 mEq/L (ref 3.5–5.1)
Sodium: 142 mEq/L (ref 135–145)

## 2018-09-17 LAB — T4, FREE: Free T4: 1.31 ng/dL (ref 0.60–1.60)

## 2018-09-17 LAB — VITAMIN D 25 HYDROXY (VIT D DEFICIENCY, FRACTURES): VITD: 36.05 ng/mL (ref 30.00–100.00)

## 2018-09-17 LAB — TSH: TSH: 1.32 u[IU]/mL (ref 0.35–4.50)

## 2018-09-17 LAB — VITAMIN B12: Vitamin B-12: 289 pg/mL (ref 211–911)

## 2018-09-17 MED ORDER — HYDROCHLOROTHIAZIDE 12.5 MG PO CAPS: 12.5000 mg | ORAL_CAPSULE | Freq: Every day | ORAL | 11 refills | Status: DC

## 2018-09-17 MED ORDER — ESCITALOPRAM OXALATE 5 MG PO TABS: 5.0000 mg | ORAL_TABLET | Freq: Every day | ORAL | 5 refills | Status: DC

## 2018-09-17 NOTE — Assessment & Plan Note (Signed)
On Levothroid 

## 2018-09-17 NOTE — Progress Notes (Signed)
Subjective:  Patient ID: Krystal Gibbs, female    DOB: 15-Aug-1960  Age: 58 y.o. MRN: [id number]  CC: No chief complaint on file.   HPI Krystal Gibbs presents for stress, anxiety, insomnia Goes to bed 1 am. Waking up at 5 am. Stressed. Follow-up on vitamin B12 deficiency.  [name] has not been taking vitamin B12  Outpatient Medications Prior to Visit  Medication Sig Dispense Refill  . ALPRAZolam (XANAX) 0.25 MG tablet Take 1 tablet (0.25 mg total) by mouth 2 (two) times daily as needed for anxiety or sleep. 30 tablet 0  . BIOTIN PO Take by mouth daily.    . Cholecalciferol (VITAMIN D PO) Take 1,000 Int'l Units by mouth daily.    Marland Kitchen conjugated estrogens (PREMARIN) vaginal cream Place 1 Applicatorful vaginally 2 (two) times a week. 42.5 g 4  . levothyroxine (SYNTHROID) 100 MCG tablet TAKE 1 TABLET BY MOUTH DAILY 90 tablet 3  . losartan (COZAAR) 100 MG tablet Take 1 tablet (100 mg total) by mouth daily. 90 tablet 3  . Multiple Vitamins-Minerals (MULTIVITAMIN PO) Take by mouth daily.    . Omega-3 Fatty Acids (FISH OIL PO) Take by mouth daily.     No facility-administered medications prior to visit.     ROS: Review of Systems  Constitutional: Positive for fatigue and unexpected weight change. Negative for activity change, appetite change and chills.  HENT: Negative for congestion, mouth sores and sinus pressure.   Eyes: Negative for visual disturbance.  Respiratory: Negative for cough and chest tightness.   Gastrointestinal: Negative for abdominal pain and nausea.  Genitourinary: Negative for difficulty urinating, frequency and vaginal pain.  Musculoskeletal: Negative for back pain and gait problem.  Skin: Negative for pallor and rash.  Neurological: Negative for dizziness, tremors, weakness, numbness and headaches.  Psychiatric/Behavioral: Positive for dysphoric mood and sleep disturbance. Negative for confusion and suicidal ideas. The patient is nervous/anxious.     Objective:   BP (!) 150/88 (BP Location: Left Arm, Patient Position: Sitting, Cuff Size: Large)   Pulse 77   Temp 98.1 F (36.7 C) (Oral)   Ht 5' 2.25" (1.581 m)   Wt 205 lb (93 kg)   SpO2 98%   BMI 37.19 kg/m   BP Readings from Last 3 Encounters:  09/17/18 (!) 150/88  02/23/18 132/88  09/24/17 (!) 150/90    Wt Readings from Last 3 Encounters:  09/17/18 205 lb (93 kg)  02/23/18 206 lb (93.4 kg)  09/24/17 197 lb (89.4 kg)    Physical Exam Constitutional:      General: She is not in acute distress.    Appearance: She is well-developed.  HENT:     Head: Normocephalic.     Right Ear: External ear normal.     Left Ear: External ear normal.     Nose: Nose normal.  Eyes:     General:        Right eye: No discharge.        Left eye: No discharge.     Conjunctiva/sclera: Conjunctivae normal.     Pupils: Pupils are equal, round, and reactive to light.  Neck:     Musculoskeletal: Normal range of motion and neck supple.     Thyroid: No thyromegaly.     Vascular: No JVD.     Trachea: No tracheal deviation.  Cardiovascular:     Rate and Rhythm: Normal rate and regular rhythm.     Heart sounds: Normal heart sounds.  Pulmonary:     Effort:  No respiratory distress.     Breath sounds: No stridor. No wheezing.  Abdominal:     General: Bowel sounds are normal. There is no distension.     Palpations: Abdomen is soft. There is no mass.     Tenderness: There is no abdominal tenderness. There is no guarding or rebound.  Musculoskeletal:        General: No tenderness.  Lymphadenopathy:     Cervical: No cervical adenopathy.  Skin:    Findings: No erythema or rash.  Neurological:     Mental Status: She is oriented to person, place, and time.     Cranial Nerves: No cranial nerve deficit.     Motor: No abnormal muscle tone.     Coordination: Coordination normal.     Deep Tendon Reflexes: Reflexes normal.  Psychiatric:        Mood and Affect: Mood normal.        Behavior: Behavior normal.         Thought Content: Thought content normal.        Judgment: Judgment normal.     Lab Results  Component Value Date   WBC 5.0 09/24/2017   HGB 13.7 09/24/2017   HCT 40.4 09/24/2017   PLT 304.0 09/24/2017   GLUCOSE 96 09/24/2017   CHOL 206 (H) 09/24/2017   TRIG 69.0 09/24/2017   HDL 64.70 09/24/2017   LDLDIRECT 133.5 04/28/2013   LDLCALC 128 (H) 09/24/2017   ALT 10 09/24/2017   AST 14 09/24/2017   NA 143 09/24/2017   K 4.1 09/24/2017   CL 107 09/24/2017   CREATININE 0.73 09/24/2017   BUN 12 09/24/2017   CO2 28 09/24/2017   TSH 0.83 02/16/2018   PSA 0.00 (L) 09/24/2017    US Thyroid  Result Date: 10/31/2017 CLINICAL DATA:  Goiter.  Nontoxic multinodular goiter. EXAM: THYROID ULTRASOUND TECHNIQUE: Ultrasound examination of the thyroid gland and adjacent soft tissues was performed. COMPARISON:  02/17/2008 FINDINGS: Parenchymal Echotexture: Moderately heterogenous Isthmus: 0.6 cm, previously 1.7 cm Right lobe: 1.4 x 0.6 x 0.9 cm, previously 5.1 x 1.6 x 2.0 cm Left lobe: 2.1 x 0.6 x 0.7 cm, previously 7.6 x 2.3 x 2.4 cm _________________________________________________________ Estimated total number of nodules >/= 1 cm: 1 Number of spongiform nodules >/=  2 cm not described below (TR1): 0 Number of mixed cystic and solid nodules >/= 1.5 cm not described below (TR2): 0 _________________________________________________________ Nodule 1 in the isthmus measures 3.0 x 1.6 x 2.7 cm and previously measured 3.9 x 2.0 x 4.5 cm. Biopsy was performed 04/17/2005. Small left lower pole nodule 2 measures 0.9 cm and does not meet criteria for biopsy nor follow-up. It previously measured up to 3.9 cm. Biopsy was performed 06/17/2008 IMPRESSION: Nodules 1 and 2 are smaller.  Both have been previously biopsied. The above is in keeping with the ACR TI-RADS recommendations - J Am Coll Radiol 2017;14:587-595. Electronically Signed   By: Marybelle Killings M.D.   On: 10/31/2017 15:06    Assessment & Plan:   There  are no diagnoses linked to this encounter.   No orders of the defined types were placed in this encounter.    Follow-up: No follow-ups on file.  Walker Kehr, MD

## 2018-09-17 NOTE — Assessment & Plan Note (Signed)
Vit D 

## 2018-09-17 NOTE — Assessment & Plan Note (Signed)
discussed

## 2018-09-17 NOTE — Assessment & Plan Note (Addendum)
Labs Not taking B12

## 2018-09-17 NOTE — Assessment & Plan Note (Signed)
Worse lexapro 5 mg/d

## 2018-09-17 NOTE — Assessment & Plan Note (Signed)
Losartan Add HCTZ

## 2018-09-18 ENCOUNTER — Other Ambulatory Visit: Payer: Self-pay | Admitting: Internal Medicine

## 2018-09-18 MED ORDER — B COMPLEX PO TABS: 1.0000 | ORAL_TABLET | Freq: Every day | ORAL | 3 refills | Status: AC

## 2018-09-26 ENCOUNTER — Other Ambulatory Visit: Payer: Self-pay | Admitting: Internal Medicine

## 2018-10-26 ENCOUNTER — Ambulatory Visit: Payer: No Typology Code available for payment source | Admitting: Internal Medicine

## 2018-12-07 ENCOUNTER — Ambulatory Visit: Payer: No Typology Code available for payment source | Admitting: Internal Medicine

## 2019-02-08 ENCOUNTER — Ambulatory Visit: Payer: No Typology Code available for payment source | Admitting: Internal Medicine

## 2019-03-10 ENCOUNTER — Other Ambulatory Visit (INDEPENDENT_AMBULATORY_CARE_PROVIDER_SITE_OTHER): Payer: 59

## 2019-03-10 ENCOUNTER — Ambulatory Visit (INDEPENDENT_AMBULATORY_CARE_PROVIDER_SITE_OTHER): Payer: 59 | Admitting: Internal Medicine

## 2019-03-10 ENCOUNTER — Encounter: Payer: Self-pay | Admitting: Internal Medicine

## 2019-03-10 ENCOUNTER — Other Ambulatory Visit: Payer: Self-pay

## 2019-03-10 VITALS — BP 140/82 | HR 67 | Temp 98.5°F | Ht 62.25 in | Wt 195.0 lb

## 2019-03-10 DIAGNOSIS — M5440 Lumbago with sciatica, unspecified side: Secondary | ICD-10-CM

## 2019-03-10 DIAGNOSIS — Z780 Asymptomatic menopausal state: Secondary | ICD-10-CM | POA: Diagnosis not present

## 2019-03-10 DIAGNOSIS — N959 Unspecified menopausal and perimenopausal disorder: Secondary | ICD-10-CM | POA: Diagnosis not present

## 2019-03-10 DIAGNOSIS — E538 Deficiency of other specified B group vitamins: Secondary | ICD-10-CM

## 2019-03-10 DIAGNOSIS — E559 Vitamin D deficiency, unspecified: Secondary | ICD-10-CM | POA: Diagnosis not present

## 2019-03-10 LAB — URINALYSIS
Bilirubin Urine: NEGATIVE
Ketones, ur: NEGATIVE
Leukocytes,Ua: NEGATIVE
Nitrite: NEGATIVE
Specific Gravity, Urine: 1.01 (ref 1.000–1.030)
Total Protein, Urine: NEGATIVE
Urine Glucose: NEGATIVE
Urobilinogen, UA: 0.2 (ref 0.0–1.0)
pH: 7 (ref 5.0–8.0)

## 2019-03-10 NOTE — Assessment & Plan Note (Signed)
UA

## 2019-03-10 NOTE — Progress Notes (Signed)
Subjective:  Patient ID: Krystal Gibbs, female    DOB: 06-17-1960  Age: 58 y.o. MRN: [id number]  CC: No chief complaint on file.   HPI Krystal Gibbs presents for HTN, anxiety, depression LMP >5 years  Outpatient Medications Prior to Visit  Medication Sig Dispense Refill  . b complex vitamins tablet Take 1 tablet by mouth daily. 100 tablet 3  . BIOTIN PO Take by mouth daily.    . Cholecalciferol (VITAMIN D PO) Take 1,000 Int'l Units by mouth daily.    Marland Kitchen conjugated estrogens (PREMARIN) vaginal cream Place 1 Applicatorful vaginally 2 (two) times a week. 42.5 g 4  . hydrochlorothiazide (MICROZIDE) 12.5 MG capsule Take 1 capsule (12.5 mg total) by mouth daily. 30 capsule 11  . levothyroxine (SYNTHROID) 100 MCG tablet TAKE 1 TABLET BY MOUTH DAILY 90 tablet 3  . losartan (COZAAR) 100 MG tablet Take 1 tablet by mouth once daily 90 tablet 3  . Multiple Vitamins-Minerals (MULTIVITAMIN PO) Take by mouth daily.    . Omega-3 Fatty Acids (FISH OIL PO) Take by mouth daily.    Marland Kitchen ALPRAZolam (XANAX) 0.25 MG tablet Take 1 tablet (0.25 mg total) by mouth 2 (two) times daily as needed for anxiety or sleep. (Patient not taking: Reported on 03/10/2019) 30 tablet 0  . escitalopram (LEXAPRO) 5 MG tablet Take 1 tablet (5 mg total) by mouth daily. (Patient not taking: Reported on 03/10/2019) 30 tablet 5   No facility-administered medications prior to visit.    ROS: Review of Systems  Constitutional: Negative for activity change, appetite change, chills, fatigue and unexpected weight change.  HENT: Negative for congestion, mouth sores and sinus pressure.   Eyes: Negative for visual disturbance.  Respiratory: Negative for cough and chest tightness.   Gastrointestinal: Negative for abdominal pain and nausea.  Genitourinary: Negative for difficulty urinating, frequency and vaginal pain.  Musculoskeletal: Positive for arthralgias. Negative for back pain and gait problem.  Skin: Negative for pallor and  rash.  Neurological: Negative for dizziness, tremors, weakness, numbness and headaches.  Psychiatric/Behavioral: Negative for confusion and sleep disturbance.    Objective:  BP 140/82 (BP Location: Left Arm, Patient Position: Sitting, Cuff Size: Large)   Pulse 67   Temp 98.5 F (36.9 C) (Oral)   Ht 5' 2.25" (1.581 m)   Wt 195 lb (88.5 kg)   SpO2 98%   BMI 35.38 kg/m   BP Readings from Last 3 Encounters:  03/10/19 140/82  09/17/18 (!) 150/88  02/23/18 132/88    Wt Readings from Last 3 Encounters:  03/10/19 195 lb (88.5 kg)  09/17/18 205 lb (93 kg)  02/23/18 206 lb (93.4 kg)    Physical Exam Constitutional:      General: She is not in acute distress.    Appearance: She is well-developed.  HENT:     Head: Normocephalic.     Right Ear: External ear normal.     Left Ear: External ear normal.     Nose: Nose normal.  Eyes:     General:        Right eye: No discharge.        Left eye: No discharge.     Conjunctiva/sclera: Conjunctivae normal.     Pupils: Pupils are equal, round, and reactive to light.  Neck:     Thyroid: No thyromegaly.     Vascular: No JVD.     Trachea: No tracheal deviation.  Cardiovascular:     Rate and Rhythm: Normal rate and regular rhythm.  Heart sounds: Normal heart sounds.  Pulmonary:     Effort: No respiratory distress.     Breath sounds: No stridor. No wheezing.  Abdominal:     General: Bowel sounds are normal. There is no distension.     Palpations: Abdomen is soft. There is no mass.     Tenderness: There is no abdominal tenderness. There is no guarding or rebound.  Musculoskeletal:        General: No tenderness.     Cervical back: Normal range of motion and neck supple.  Lymphadenopathy:     Cervical: No cervical adenopathy.  Skin:    Findings: No erythema or rash.  Neurological:     Mental Status: She is oriented to person, place, and time.     Cranial Nerves: No cranial nerve deficit.     Motor: No abnormal muscle tone.      Coordination: Coordination normal.     Deep Tendon Reflexes: Reflexes normal.  Psychiatric:        Behavior: Behavior normal.        Thought Content: Thought content normal.        Judgment: Judgment normal.     Lab Results  Component Value Date   WBC 5.5 09/17/2018   HGB 13.3 09/17/2018   HCT 40.1 09/17/2018   PLT 376.0 09/17/2018   GLUCOSE 91 09/17/2018   CHOL 206 (H) 09/24/2017   TRIG 69.0 09/24/2017   HDL 64.70 09/24/2017   LDLDIRECT 133.5 04/28/2013   LDLCALC 128 (H) 09/24/2017   ALT 12 09/17/2018   AST 13 09/17/2018   NA 142 09/17/2018   K 4.0 09/17/2018   CL 109 09/17/2018   CREATININE 0.68 09/17/2018   BUN 16 09/17/2018   CO2 26 09/17/2018   TSH 1.32 09/17/2018   PSA 0.00 (L) 09/24/2017    US THYROID  Result Date: 10/31/2017 CLINICAL DATA:  Goiter.  Nontoxic multinodular goiter. EXAM: THYROID ULTRASOUND TECHNIQUE: Ultrasound examination of the thyroid gland and adjacent soft tissues was performed. COMPARISON:  02/17/2008 FINDINGS: Parenchymal Echotexture: Moderately heterogenous Isthmus: 0.6 cm, previously 1.7 cm Right lobe: 1.4 x 0.6 x 0.9 cm, previously 5.1 x 1.6 x 2.0 cm Left lobe: 2.1 x 0.6 x 0.7 cm, previously 7.6 x 2.3 x 2.4 cm _________________________________________________________ Estimated total number of nodules >/= 1 cm: 1 Number of spongiform nodules >/=  2 cm not described below (TR1): 0 Number of mixed cystic and solid nodules >/= 1.5 cm not described below (TR2): 0 _________________________________________________________ Nodule 1 in the isthmus measures 3.0 x 1.6 x 2.7 cm and previously measured 3.9 x 2.0 x 4.5 cm. Biopsy was performed 04/17/2005. Small left lower pole nodule 2 measures 0.9 cm and does not meet criteria for biopsy nor follow-up. It previously measured up to 3.9 cm. Biopsy was performed 06/17/2008 IMPRESSION: Nodules 1 and 2 are smaller.  Both have been previously biopsied. The above is in keeping with the ACR TI-RADS recommendations - J Am  Coll Radiol 2017;14:587-595. Electronically Signed   By: Marybelle Killings M.D.   On: 10/31/2017 15:06    Assessment & Plan:   There are no diagnoses linked to this encounter.   No orders of the defined types were placed in this encounter.    Follow-up: No follow-ups on file.  Walker Kehr, MD

## 2019-03-10 NOTE — Patient Instructions (Signed)

## 2019-03-10 NOTE — Assessment & Plan Note (Signed)
Vit D 

## 2019-03-10 NOTE — Assessment & Plan Note (Signed)
LMP ?2015

## 2019-03-10 NOTE — Assessment & Plan Note (Signed)
On Vit B complex

## 2019-03-11 ENCOUNTER — Other Ambulatory Visit: Payer: Self-pay

## 2019-03-12 ENCOUNTER — Ambulatory Visit (INDEPENDENT_AMBULATORY_CARE_PROVIDER_SITE_OTHER)
Admission: RE | Admit: 2019-03-12 | Discharge: 2019-03-12 | Disposition: A | Discharge status: Home / Self Care | Payer: 59 | Source: Ambulatory Visit | Attending: Internal Medicine | Admitting: Internal Medicine

## 2019-03-12 DIAGNOSIS — N959 Unspecified menopausal and perimenopausal disorder: Secondary | ICD-10-CM

## 2019-03-15 ENCOUNTER — Other Ambulatory Visit: Payer: Self-pay

## 2019-03-15 ENCOUNTER — Encounter: Payer: Self-pay | Admitting: Certified Nurse Midwife

## 2019-03-15 ENCOUNTER — Ambulatory Visit (INDEPENDENT_AMBULATORY_CARE_PROVIDER_SITE_OTHER): Payer: 59 | Admitting: Certified Nurse Midwife

## 2019-03-15 ENCOUNTER — Other Ambulatory Visit (HOSPITAL_COMMUNITY)
Admission: RE | Admit: 2019-03-15 | Discharge: 2019-03-15 | Disposition: A | Discharge status: Home / Self Care | Payer: 59 | Source: Ambulatory Visit | Attending: Obstetrics & Gynecology | Admitting: Obstetrics & Gynecology

## 2019-03-15 VITALS — BP 124/82 | HR 68 | Temp 97.3°F | Resp 16 | Ht 62.25 in | Wt 198.0 lb

## 2019-03-15 DIAGNOSIS — N952 Postmenopausal atrophic vaginitis: Secondary | ICD-10-CM

## 2019-03-15 DIAGNOSIS — Z01419 Encounter for gynecological examination (general) (routine) without abnormal findings: Secondary | ICD-10-CM

## 2019-03-15 DIAGNOSIS — Z124 Encounter for screening for malignant neoplasm of cervix: Secondary | ICD-10-CM | POA: Diagnosis not present

## 2019-03-15 NOTE — Progress Notes (Signed)
58 y.o. G1P1001 Married  African American Fe here to re-establish gyn care and for annual exam. Post menopausal no HRT. Recent move back from [place], "too hot". Occasional night sweats,occasional vaginal dryness. Noted slight pink from vagina after lifting recently. No bright red bleeding noted and does not feel it was from rectum. Recent sexual activity using Ky jelly for lubrication. Had recent visit with Dr. Alain Marion, all normal with hypothyroid, hypertension/anxiety medication. Has not used any Premarin cream due being to expired. Would like update. Has mammogram scheduled in 03/2019. No other health issues today.  No LMP recorded. Patient has had a hysterectomy.          Sexually active: Yes.    The current method of family planning is status post hysterectomy.    Exercising: Yes.    walking Smoker:  no  Review of Systems  Constitutional: Negative.   HENT: Negative.   Eyes: Negative.   Respiratory: Negative.   Cardiovascular: Negative.   Gastrointestinal: Negative.   Genitourinary: Negative.   Musculoskeletal: Negative.   Skin: Negative.   Neurological: Negative.   Endo/Heme/Allergies: Negative.   Psychiatric/Behavioral: Negative.     Health Maintenance: Pap:  2018 neg per patient History of Abnormal Pap: no MMG:  2018 neg per patient Self Breast exams: yes Colonoscopy: none, to do cologard BMD:   2020 TDaP:  2012 Shingles: 2013 Pneumonia: not done Hep C and HIV: not done Labs: no   reports that she has never smoked. She has never used smokeless tobacco. She reports that she does not drink alcohol or use drugs.  Past Medical History:  Diagnosis Date  . Accidental fall    on or from other stairs or steps  . Allergic rhinitis, cause unspecified   . Cervicalgia   . Chest pain, unspecified   . Contusion of face, scalp, and neck except eye(s)   . Fibroid   . Hypertension 2011  . Hypothyroidism 2011  . Leg pain   . Low back pain   . 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 Pain in limb   . Rash and other nonspecific skin eruption   . Unspecified pruritic disorder   . Unspecified vitamin D deficiency     Past Surgical History:  Procedure Laterality Date  . ABDOMINAL HYSTERECTOMY  1999   TAH, ovaries retained    Current Outpatient Medications  Medication Sig Dispense Refill  . b complex vitamins tablet Take 1 tablet by mouth daily. 100 tablet 3  . BIOTIN PO Take by mouth daily.    . Cholecalciferol (VITAMIN D PO) Take 1,000 Int'l Units by mouth daily.    Marland Kitchen conjugated estrogens (PREMARIN) vaginal cream Place 1 Applicatorful vaginally 2 (two) times a week. 42.5 g 4  . hydrochlorothiazide (MICROZIDE) 12.5 MG capsule Take 1 capsule (12.5 mg total) by mouth daily. 30 capsule 11  . levothyroxine (SYNTHROID) 100 MCG tablet TAKE 1 TABLET BY MOUTH DAILY 90 tablet 3  . losartan (COZAAR) 100 MG tablet Take 1 tablet by mouth once daily 90 tablet 3  . Multiple Vitamins-Minerals (MULTIVITAMIN PO) Take by mouth daily.    . Omega-3 Fatty Acids (FISH OIL PO) Take by mouth daily.    Marland Kitchen ALPRAZolam (XANAX) 0.25 MG tablet Take 1 tablet (0.25 mg total) by mouth 2 (two) times daily as needed for anxiety or sleep. (Patient not taking: Reported on 03/10/2019) 30 tablet 0   No current facility-administered medications for this visit.    Family History  Problem Relation Age of Onset  . Coronary artery disease Neg Hx   . Thyroid disease Neg Hx   . Early death Mother   . Diabetes Sister   . Diabetes Paternal Uncle     ROS:  Pertinent items are noted in HPI.  Otherwise, a comprehensive ROS was negative.  Exam:   BP 124/82   Pulse 68   Temp (!) 97.3 F (36.3 C) (Skin)   Resp 16   Ht 5' 2.25" (1.581 m)   Wt 198 lb (89.8 kg)   BMI 35.92 kg/m  Height: 5' 2.25" (158.1 cm) Ht Readings from Last 3 Encounters:  03/15/19 5' 2.25" (1.581 m)  03/10/19 5' 2.25" (1.581 m)  09/17/18 5' 2.25" (1.581 m)    General appearance: alert,  cooperative and appears stated age Head: Normocephalic, without obvious abnormality, atraumatic Neck: no adenopathy, supple, symmetrical, trachea midline and thyroid normal to inspection and palpation Lungs: clear to auscultation bilaterally Breasts: normal appearance, no masses or tenderness, No nipple retraction or dimpling, No nipple discharge or bleeding, No axillary or supraclavicular adenopathy, pendulous bilateral Heart: regular rate and rhythm Abdomen: soft, non-tender; no masses,  no organomegaly Extremities: extremities normal, atraumatic, no cyanosis or edema Skin: Skin color, texture, turgor normal. No rashes or lesions Lymph nodes: Cervical, supraclavicular, and axillary nodes normal. No abnormal inguinal nodes palpated Neurologic: Grossly normal   Pelvic: External genitalia:  no lesions              Urethra:  normal appearing urethra with no masses, tenderness or lesions              Bartholin's and Skene's: normal                 Vagina: slight atrophic appearing vagina with normal color and discharge, no lesions, small area of irritation noted in posterior fornix on right, pap taken of area              Cervix: absent              Pap taken: Yes.   Bimanual Exam:  Uterus:  uterus absent              Adnexa: normal adnexa and no mass, fullness, tenderness               Rectovaginal: Confirms               Anus:  normal sphincter tone, no lesions  Chaperone present: yes  A:  Well Woman with normal exam  TAH with ovaries retained for bleeding  Atrophic vaginitis mild uses Premarin cream prn ( not in a while)  Hypertension/Hypothyroidism,anxiety with PCP management, all stable medications  P:   Reviewed health and wellness pertinent to exam  Discussed vaginal finding and feel related to atrophy, but will await pap smear results. Will renew once pap and mammogram in( has not had in 2 years). Discussed Olive oil use for moisture and sexual activity.  Continue follow up  with PCP as indicated.  Pap smear: yes   counseled on breast self exam, mammography screening, feminine hygiene, menopause, adequate intake of calcium and vitamin D, diet and exercise, has Cologard scheduled through PCP.  return annually or prn  An After Visit Summary was printed and given to the patient.

## 2019-03-15 NOTE — Patient Instructions (Signed)
EXERCISE AND DIET:  We recommended that you start or continue a regular exercise program for good health. Regular exercise means any activity that makes your heart beat faster and makes you sweat.  We recommend exercising at least 30 minutes per day at least 3 days a week, preferably 4 or 5.  We also recommend a diet low in fat and sugar.  Inactivity, poor dietary choices and obesity can cause diabetes, heart attack, stroke, and kidney damage, among others.   ° °ALCOHOL AND SMOKING:  Women should limit their alcohol intake to no more than 7 drinks/beers/glasses of wine (combined, not each!) per week. Moderation of alcohol intake to this level decreases your risk of breast cancer and liver damage. And of course, no recreational drugs are part of a healthy lifestyle.  And absolutely no smoking or even second hand smoke. Most people know smoking can cause heart and lung diseases, but did you know it also contributes to weakening of your bones? Aging of your skin?  Yellowing of your teeth and nails? ° °CALCIUM AND VITAMIN D:  Adequate intake of calcium and Vitamin D are recommended.  The recommendations for exact amounts of these supplements seem to change often, but generally speaking 600 mg of calcium (either carbonate or citrate) and 800 units of Vitamin D per day seems prudent. Certain women may benefit from higher intake of Vitamin D.  If you are among these women, your doctor will have told you during your visit.   ° °PAP SMEARS:  Pap smears, to check for cervical cancer or precancers,  have traditionally been done yearly, although recent scientific advances have shown that most women can have pap smears less often.  However, every woman still should have a physical exam from her gynecologist every year. It will include a breast check, inspection of the vulva and vagina to check for abnormal growths or skin changes, a visual exam of the cervix, and then an exam to evaluate the size and shape of the uterus and  ovaries.  And after 60 years of age, a rectal exam is indicated to check for rectal cancers. We will also provide age appropriate advice regarding health maintenance, like when you should have certain vaccines, screening for sexually transmitted diseases, bone density testing, colonoscopy, mammograms, etc.  ° °MAMMOGRAMS:  All women over 40 years old should have a yearly mammogram. Many facilities now offer a "3D" mammogram, which may cost around $50 extra out of pocket. If possible,  we recommend you accept the option to have the 3D mammogram performed.  It both reduces the number of women who will be called back for extra views which then turn out to be normal, and it is better than the routine mammogram at detecting truly abnormal areas.   ° °COLONOSCOPY:  Colonoscopy to screen for colon cancer is recommended for all women at age 50.  We know, you hate the idea of the prep.  We agree, BUT, having colon cancer and not knowing it is worse!!  Colon cancer so often starts as a polyp that can be seen and removed at colonscopy, which can quite literally save your life!  And if your first colonoscopy is normal and you have no family history of colon cancer, most women don't have to have it again for 10 years.  Once every ten years, you can do something that may end up saving your life, right?  We will be happy to help you get it scheduled when you are ready.    Be sure to check your insurance coverage so you understand how much it will cost.  It may be covered as a preventative service at no cost, but you should check your particular policy.   ° ° ° °Atrophic Vaginitis °Atrophic vaginitis is a condition in which the tissues that line the vagina become dry and thin. This condition occurs in women who have stopped having their period. It is caused by a drop in a female hormone (estrogen). This hormone helps: °· To keep the vagina moist. °· To make a clear fluid. This clear fluid helps: °? To make the vagina ready for  sex. °? To protect the vagina from infection. °If the lining of the vagina is dry and thin, it may cause irritation, burning, or itchiness. It may also: °· Make sex painful. °· Make an exam of your vagina painful. °· Cause bleeding. °· Make you lose interest in sex. °· Cause a burning feeling when you pee (urinate). °· Cause a brown or yellow fluid to come from your vagina. °Some women do not have symptoms. °Follow these instructions at home: °Medicines °· Take over-the-counter and prescription medicines only as told by your doctor. °· Do not use herbs or other medicines unless your doctor says it is okay. °· Use medicines for for dryness. These include: °? Oils to make the vagina soft. °? Creams. °? Moisturizers. °General instructions °· Do not douche. °· Do not use products that can make your vagina dry. These include: °? Scented sprays. °? Scented tampons. °? Scented soaps. °· Sex can help increase blood flow and soften the tissue in the vagina. If it hurts to have sex: °? Tell your partner. °? Use products to make sex more comfortable. Use these only as told by your doctor. °Contact a doctor if you: °· Have discharge from the vagina that is different than usual. °· Have a bad smell coming from your vagina. °· Have new symptoms. °· Do not get better. °· Get worse. °Summary °· Atrophic vaginitis is a condition in which the lining of the vagina becomes dry and thin. °· This condition affects women who have stopped having their periods. °· Treatment may include using products that help make the vagina soft. °· Call a doctor if do not get better with treatment. °This information is not intended to replace advice given to you by your health care provider. Make sure you discuss any questions you have with your health care provider. °Document Released: 08/28/2007 Document Revised: 03/24/2017 Document Reviewed: 03/24/2017 °Elsevier Patient Education © 2020 [employer] ° °

## 2019-03-16 ENCOUNTER — Telehealth: Payer: Self-pay | Admitting: Certified Nurse Midwife

## 2019-03-16 DIAGNOSIS — Z1231 Encounter for screening mammogram for malignant neoplasm of breast: Secondary | ICD-10-CM | POA: Diagnosis not present

## 2019-03-16 NOTE — Telephone Encounter (Signed)
[place], Carely "[name]"  [place] [facility] Clinical Pool  Phone Number: [phone]  Sonnie Alamo, just a FYI my Mammo exam has been rescheduled to Tuesday 03/16/19, instead of Jan 2021. Just so you would be aware.   Thank you, Kalman Shan

## 2019-03-16 NOTE — Telephone Encounter (Signed)
Per review of AEX 03/15/19, patient needs updated MMG for refill of premarin.   Routing to [employer], CNM FYI.

## 2019-03-17 ENCOUNTER — Other Ambulatory Visit: Payer: Self-pay | Admitting: Certified Nurse Midwife

## 2019-03-17 DIAGNOSIS — B373 Candidiasis of vulva and vagina: Secondary | ICD-10-CM

## 2019-03-17 DIAGNOSIS — B3731 Acute candidiasis of vulva and vagina: Secondary | ICD-10-CM

## 2019-03-17 LAB — CYTOLOGY - PAP
Comment: NEGATIVE
Diagnosis: NEGATIVE
High risk HPV: NEGATIVE

## 2019-03-17 MED ORDER — FLUCONAZOLE 150 MG PO TABS: ORAL_TABLET | ORAL | 0 refills | Status: DC

## 2019-03-27 MED ORDER — HYDROCHLOROTHIAZIDE 12.5 MG PO CAPS: 12.5000 mg | ORAL_CAPSULE | Freq: Every day | ORAL | 3 refills | Status: DC

## 2019-03-27 MED ORDER — LOSARTAN POTASSIUM 100 MG PO TABS: 100.0000 mg | ORAL_TABLET | Freq: Every day | ORAL | 3 refills | Status: DC

## 2019-03-27 MED ORDER — ALPRAZOLAM 0.25 MG PO TABS: 0.2500 mg | ORAL_TABLET | Freq: Two times a day (BID) | ORAL | 1 refills | Status: DC | PRN

## 2019-03-27 MED ORDER — LEVOTHYROXINE SODIUM 100 MCG PO TABS: 100.0000 ug | ORAL_TABLET | Freq: Every day | ORAL | 3 refills | Status: DC

## 2019-03-29 ENCOUNTER — Telehealth: Payer: Self-pay | Admitting: Certified Nurse Midwife

## 2019-03-29 NOTE — Telephone Encounter (Signed)
Patient sent the following message through Elsa. Routing to triage to assist patient with request.  Krystal Gibbs, Krystal Gibbs "[facility] Clinical [facility]  Phone Number: [phone]  Krystal Gibbs, I took the medication that you ordered. How will I know if the symptoms are cleared up?   Thank you,  Krystal Gibbs

## 2019-03-30 NOTE — Telephone Encounter (Signed)
Spoke with patient. 03/15/19 pap positive for yeast, tx with diflucan x2. Patient states she did not have any symptoms prior to treatment, asking how she will know if yeast has resolved?   Patient denies any symptoms currently. Reviewed common signs of yeast: vaginal itching, redness, white vag d/c, vaginal discomfort. Advised patient retesting not necessary if no symptoms present. Advised patient to monitor and return call for OV if any of these symptoms develop. Advised Melvia Heaps, CNM will review, our office will return call if any additional recommendations. Patient agreeable.   Routing to provider for final review. Patient is agreeable to disposition. Will close encounter.

## 2019-04-04 ENCOUNTER — Other Ambulatory Visit: Payer: Self-pay | Admitting: Internal Medicine

## 2019-04-04 DIAGNOSIS — M545 Low back pain, unspecified: Secondary | ICD-10-CM

## 2019-04-13 ENCOUNTER — Ambulatory Visit
Admission: RE | Admit: 2019-04-13 | Discharge: 2019-04-13 | Disposition: A | Discharge status: Home / Self Care | Payer: 59 | Source: Ambulatory Visit | Attending: Internal Medicine | Admitting: Internal Medicine

## 2019-04-13 DIAGNOSIS — M545 Low back pain, unspecified: Secondary | ICD-10-CM

## 2019-04-13 DIAGNOSIS — R109 Unspecified abdominal pain: Secondary | ICD-10-CM | POA: Diagnosis not present

## 2019-05-13 ENCOUNTER — Encounter: Payer: Self-pay | Admitting: Internal Medicine

## 2019-05-13 ENCOUNTER — Ambulatory Visit (INDEPENDENT_AMBULATORY_CARE_PROVIDER_SITE_OTHER): Payer: 59 | Admitting: Internal Medicine

## 2019-05-13 ENCOUNTER — Other Ambulatory Visit: Payer: Self-pay

## 2019-05-13 DIAGNOSIS — G8929 Other chronic pain: Secondary | ICD-10-CM

## 2019-05-13 DIAGNOSIS — R109 Unspecified abdominal pain: Secondary | ICD-10-CM | POA: Diagnosis not present

## 2019-05-13 NOTE — Progress Notes (Signed)
Virtual Visit via Telephone Note  I connected with Krystal Gibbs on 05/13/19 at  8:30 AM EST by telephone and verified that I am speaking with the correct person using two identifiers.   I discussed the limitations, risks, security and privacy concerns of performing an evaluation and management service by telephone and the availability of in person appointments. I also discussed with the patient that there may be a patient responsible charge related to this service. The patient expressed understanding and agreed to proceed.   History of Present Illness:   We were supposed to have a virtual visit with Krystal Gibbs.  We had to switch to phone call because of no connection.  Krystal Gibbs is complaining of the ongoing pain in the distal part of her left flank, above left buttock that she rates at 7-8 out of 10.  It is worse at night and early in the morning.  Sometimes it feels like a catch in the back.  There is no fever, urinary symptoms, nausea, weight loss etc. this could be going on for over a month. Observations/Objective:  Krystal Gibbs sounds normal on the phone Assessment and Plan:  See assessment and plan Follow Up Instructions:    I discussed the assessment and treatment plan with the patient. The patient was provided an opportunity to ask questions and all were answered. The patient agreed with the plan and demonstrated an understanding of the instructions.   The patient was advised to call back or seek an in-person evaluation if the symptoms worsen or if the condition fails to improve as anticipated.  I provided 22 minutes of non-face-to-face time during this encounter.   Walker Kehr, MD

## 2019-05-13 NOTE — Assessment & Plan Note (Signed)
The pain is not better.  Krystal Gibbs is concerned.  Etiology is unclear.  We will obtain lab work and order abdominal/pelvis CT scan with contrast.

## 2019-06-15 ENCOUNTER — Encounter: Payer: Self-pay | Admitting: Certified Nurse Midwife

## 2019-06-17 ENCOUNTER — Other Ambulatory Visit: Payer: Self-pay | Admitting: Internal Medicine

## 2019-06-17 DIAGNOSIS — G8929 Other chronic pain: Secondary | ICD-10-CM

## 2019-06-30 ENCOUNTER — Encounter: Payer: Self-pay | Admitting: Family Medicine

## 2019-06-30 ENCOUNTER — Ambulatory Visit (INDEPENDENT_AMBULATORY_CARE_PROVIDER_SITE_OTHER): Payer: 59

## 2019-06-30 ENCOUNTER — Ambulatory Visit: Payer: 59 | Admitting: Family Medicine

## 2019-06-30 ENCOUNTER — Other Ambulatory Visit: Payer: Self-pay

## 2019-06-30 VITALS — BP 138/78 | HR 82 | Ht 62.25 in | Wt 195.2 lb

## 2019-06-30 DIAGNOSIS — G8929 Other chronic pain: Secondary | ICD-10-CM | POA: Diagnosis not present

## 2019-06-30 DIAGNOSIS — M545 Low back pain, unspecified: Secondary | ICD-10-CM

## 2019-06-30 DIAGNOSIS — M1612 Unilateral primary osteoarthritis, left hip: Secondary | ICD-10-CM | POA: Diagnosis not present

## 2019-06-30 DIAGNOSIS — M25552 Pain in left hip: Secondary | ICD-10-CM

## 2019-06-30 DIAGNOSIS — M47816 Spondylosis without myelopathy or radiculopathy, lumbar region: Secondary | ICD-10-CM | POA: Diagnosis not present

## 2019-06-30 NOTE — Patient Instructions (Addendum)
Thank you for coming in today.  Get xray today on your way out.  Try using a heating pad and TENS unit.  Tylenol arthritis 650mg  extended release is a good option.   Recheck in about 1 month.  Return or contact me sooner if needed or not doing well.   TENS UNIT: This is helpful for muscle pain and spasm.   Search and Purchase a TENS 7000 2nd edition at  www.tenspros.com or www.[email or web address].com It should be less than $30.     TENS unit instructions: Do not shower or bathe with the unit on Turn the unit off before removing electrodes or batteries If the electrodes lose stickiness add a drop of water to the electrodes after they are disconnected from the unit and place on plastic sheet. If you continued to have difficulty, call the TENS unit company to purchase more electrodes. Do not apply lotion on the skin area prior to use. Make sure the skin is clean and dry as this will help prolong the life of the electrodes. After use, always check skin for unusual red areas, rash or other skin difficulties. If there are any skin problems, does not apply electrodes to the same area. Never remove the electrodes from the unit by pulling the wires. Do not use the TENS unit or electrodes other than as directed. Do not change electrode placement without consultating your therapist or physician. Keep 2 fingers with between each electrode. Wear time ratio is 2:1, on to off times.    For example on for 30 minutes off for 15 minutes and then on for 30 minutes off for 15 minutes

## 2019-06-30 NOTE — Progress Notes (Signed)
Subjective:    I'm seeing this patient as a consultation for:  Dr. Alain Marion. Note will be routed back to referring provider/PCP.  CC: L-sided low back pain  I, Molly Weber, LAT, ATC, am serving as scribe for Dr. Lynne Leader.  HPI: Pt is a 58 y/o female presenting w/ c/o L-sided low back pain x 6 months with radiating pain into her l anterior thigh x 2-3 months.  She rates her pain at a 7-8/10 and describes her pain as sharp.  She notes the pain into her thigh typically is worse with prolonged sitting or prolonged standing.  She has difficulty getting her shoes and socks on because of the pain at times.  She also has pain when she is driving.  Overall her back pain is worse than her thigh pain.  No pain radiating below the level of the knee.  No bladder or bladder dysfunction fevers or chills.  She has tried some over-the-counter medications for pain which have helped only a little.  Radiating pain: yes into her L anterior thigh LE numbness/tingling: No Aggravating factors: Worse first thing in the morning and at night; lumbar flexion; squatting and returning to stand; prolonged sitting; prolonged walking Treatments tried: Yoga, stretching  Past medical history, Surgical history, Family history, Social history, Allergies, and medications have been entered into the medical record, reviewed.   Review of Systems: No new headache, visual changes, nausea, vomiting, diarrhea, constipation, dizziness, abdominal pain, skin rash, fevers, chills, night sweats, weight loss, swollen lymph nodes, body aches, joint swelling, muscle aches, chest pain, shortness of breath, mood changes, visual or auditory hallucinations.   Objective:    Vitals:   06/30/19 0949  BP: 138/78  Pulse: 82  SpO2: 96%   General: Well Developed, well nourished, and in no acute distress.  Neuro/Psych: Alert and oriented x3, extra-ocular muscles intact, able to move all 4 extremities, sensation grossly intact. Skin: Warm and  dry, no rashes noted.  Respiratory: Not using accessory muscles, speaking in full sentences, trachea midline.  Cardiovascular: Pulses palpable, no extremity edema. Abdomen: Does not appear distended. MSK:  Spine nontender spinal midline normal. No particular tender paraspinal musculature. Normal lumbar motion. Lower extremity strength reflexes and sensation are intact distally.  Left hip: Normal-appearing nontender. Decreased hip motion to internal rotation and flexion reproducing pain. Hip motion strength is intact. Pulses cap refill and sensation are intact distally.  Lab and Radiology Results  X-ray images L-spine and left hip obtained today personally and independently reviewed.  L-spine: DJD and facet DJD worse at L5-S1.  No acute fractures.  No spondylolisthesis  Left hip: Moderate hip DJD.  No acute fractures.  Await formal radiology review  Impression and Recommendations:    Assessment and Plan: 59 y.o. female with low back pain ongoing for 6 months.  Patient has degenerative changes seen throughout lumbar spine.  Regardless she likely also has a component of muscle dysfunction.  Plan for referral to physical therapy for core strengthening.  Additionally recommend heating pad and TENS unit.  Recheck back in 1 month.  Left thigh pain: Pain reproducible with hip flexion and internal rotation.  Patient has degenerative changes seen on x-ray.  It is possible that some of her pain is hip flexor related.  We will try a little bit of physical therapy however not very optimistic.  Recheck back in a month likely will proceed with diagnostic and therapeutic injection.  Discussed with patient that she may require hip replacement before too long.  PDMP not reviewed this encounter. Orders Placed This Encounter  Procedures  . DG Lumbar Spine 2-3 Views    Standing Status:   Future    Number of Occurrences:   1    Standing Expiration Date:   08/29/2020    Order Specific Question:    Reason for Exam (SYMPTOM  OR DIAGNOSIS REQUIRED)    Answer:   low back pain    Order Specific Question:   Is patient pregnant?    Answer:   No    Order Specific Question:   Preferred imaging location?    Answer:   [facility]  . DG HIP UNILAT W OR W/O PELVIS 2-3 VIEWS LEFT    Standing Status:   Future    Number of Occurrences:   1    Standing Expiration Date:   08/29/2020    Order Specific Question:   Reason for Exam (SYMPTOM  OR DIAGNOSIS REQUIRED)    Answer:   low back pain    Order Specific Question:   Is patient pregnant?    Answer:   No    Order Specific Question:   Preferred imaging location?    Answer:   [facility]  . Ambulatory referral to Physical Therapy    Referral Priority:   Routine    Referral Type:   Physical Medicine    Referral Reason:   Specialty Services Required    Requested Specialty:   Physical Therapy   No orders of the defined types were placed in this encounter.   Discussed warning signs or symptoms. Please see discharge instructions. Patient expresses understanding.   The above documentation has been reviewed and is accurate and complete Lynne Leader

## 2019-07-01 NOTE — Progress Notes (Signed)
X-ray lumbar spine shows mild multilevel arthritis.  No fractures.

## 2019-07-01 NOTE — Progress Notes (Signed)
X-ray left hip shows medium arthritis.  If not better with physical therapy will try injection likely.

## 2019-07-30 ENCOUNTER — Ambulatory Visit: Payer: 59 | Admitting: Family Medicine

## 2019-08-10 ENCOUNTER — Other Ambulatory Visit: Payer: Self-pay | Admitting: Internal Medicine

## 2019-08-10 DIAGNOSIS — E538 Deficiency of other specified B group vitamins: Secondary | ICD-10-CM

## 2019-08-10 DIAGNOSIS — I10 Essential (primary) hypertension: Secondary | ICD-10-CM

## 2019-08-10 DIAGNOSIS — E559 Vitamin D deficiency, unspecified: Secondary | ICD-10-CM

## 2019-08-10 DIAGNOSIS — E034 Atrophy of thyroid (acquired): Secondary | ICD-10-CM

## 2019-08-11 DIAGNOSIS — I10 Essential (primary) hypertension: Secondary | ICD-10-CM

## 2019-08-30 ENCOUNTER — Other Ambulatory Visit (INDEPENDENT_AMBULATORY_CARE_PROVIDER_SITE_OTHER): Payer: 59

## 2019-08-30 ENCOUNTER — Other Ambulatory Visit: Payer: Self-pay

## 2019-08-30 DIAGNOSIS — E538 Deficiency of other specified B group vitamins: Secondary | ICD-10-CM | POA: Diagnosis not present

## 2019-08-30 DIAGNOSIS — E034 Atrophy of thyroid (acquired): Secondary | ICD-10-CM | POA: Diagnosis not present

## 2019-08-30 DIAGNOSIS — E559 Vitamin D deficiency, unspecified: Secondary | ICD-10-CM

## 2019-08-30 DIAGNOSIS — I10 Essential (primary) hypertension: Secondary | ICD-10-CM

## 2019-08-30 LAB — URINALYSIS, ROUTINE W REFLEX MICROSCOPIC
Bilirubin Urine: NEGATIVE
Ketones, ur: NEGATIVE
Leukocytes,Ua: NEGATIVE
Nitrite: NEGATIVE
RBC / HPF: NONE SEEN (ref 0–?)
Specific Gravity, Urine: 1.01 (ref 1.000–1.030)
Total Protein, Urine: NEGATIVE
Urine Glucose: NEGATIVE
Urobilinogen, UA: 0.2 (ref 0.0–1.0)
WBC, UA: NONE SEEN (ref 0–?)
pH: 6 (ref 5.0–8.0)

## 2019-08-30 LAB — CBC WITH DIFFERENTIAL/PLATELET
Basophils Absolute: 0 10*3/uL (ref 0.0–0.1)
Basophils Relative: 0.4 % (ref 0.0–3.0)
Eosinophils Absolute: 0.1 10*3/uL (ref 0.0–0.7)
Eosinophils Relative: 2.4 % (ref 0.0–5.0)
HCT: 38.6 % (ref 36.0–46.0)
Hemoglobin: 12.9 g/dL (ref 12.0–15.0)
Lymphocytes Relative: 35.2 % (ref 12.0–46.0)
Lymphs Abs: 2 10*3/uL (ref 0.7–4.0)
MCHC: 33.5 g/dL (ref 30.0–36.0)
MCV: 85.8 fl (ref 78.0–100.0)
Monocytes Absolute: 0.7 10*3/uL (ref 0.1–1.0)
Monocytes Relative: 12 % (ref 3.0–12.0)
Neutro Abs: 2.8 10*3/uL (ref 1.4–7.7)
Neutrophils Relative %: 50 % (ref 43.0–77.0)
Platelets: 337 10*3/uL (ref 150.0–400.0)
RBC: 4.49 Mil/uL (ref 3.87–5.11)
RDW: 13.6 % (ref 11.5–15.5)
WBC: 5.6 10*3/uL (ref 4.0–10.5)

## 2019-08-30 LAB — BASIC METABOLIC PANEL
BUN: 12 mg/dL (ref 6–23)
CO2: 27 mEq/L (ref 19–32)
Calcium: 9.2 mg/dL (ref 8.4–10.5)
Chloride: 105 mEq/L (ref 96–112)
Creatinine, Ser: 0.72 mg/dL (ref 0.40–1.20)
GFR: 99.65 mL/min (ref >60.00)
Glucose, Bld: 92 mg/dL (ref 70–99)
Potassium: 4.4 mEq/L (ref 3.5–5.1)
Sodium: 137 mEq/L (ref 135–145)

## 2019-08-30 LAB — HEPATIC FUNCTION PANEL
ALT: 11 U/L (ref 0–35)
AST: 17 U/L (ref 0–37)
Albumin: 4.1 g/dL (ref 3.5–5.2)
Alkaline Phosphatase: 79 U/L (ref 39–117)
Bilirubin, Direct: 0.1 mg/dL (ref 0.0–0.3)
Total Bilirubin: 0.4 mg/dL (ref 0.2–1.2)
Total Protein: 6.8 g/dL (ref 6.0–8.3)

## 2019-08-30 LAB — T4, FREE: Free T4: 1.69 ng/dL — ABNORMAL HIGH (ref 0.60–1.60)

## 2019-08-30 LAB — VITAMIN D 25 HYDROXY (VIT D DEFICIENCY, FRACTURES): VITD: 39.99 ng/mL (ref 30.00–100.00)

## 2019-08-30 LAB — VITAMIN B12: Vitamin B-12: 257 pg/mL (ref 211–911)

## 2019-08-30 LAB — TSH: TSH: 0.55 u[IU]/mL (ref 0.35–4.50)

## 2019-09-08 ENCOUNTER — Encounter: Payer: Self-pay | Admitting: Internal Medicine

## 2019-09-08 ENCOUNTER — Ambulatory Visit: Payer: 59 | Admitting: Internal Medicine

## 2019-09-08 ENCOUNTER — Other Ambulatory Visit: Payer: Self-pay

## 2019-09-08 DIAGNOSIS — E034 Atrophy of thyroid (acquired): Secondary | ICD-10-CM | POA: Diagnosis not present

## 2019-09-08 DIAGNOSIS — E559 Vitamin D deficiency, unspecified: Secondary | ICD-10-CM | POA: Diagnosis not present

## 2019-09-08 DIAGNOSIS — E042 Nontoxic multinodular goiter: Secondary | ICD-10-CM | POA: Diagnosis not present

## 2019-09-08 DIAGNOSIS — E538 Deficiency of other specified B group vitamins: Secondary | ICD-10-CM | POA: Diagnosis not present

## 2019-09-08 DIAGNOSIS — I10 Essential (primary) hypertension: Secondary | ICD-10-CM | POA: Diagnosis not present

## 2019-09-08 MED ORDER — LOSARTAN POTASSIUM-HCTZ 100-12.5 MG PO TABS: 1.0000 | ORAL_TABLET | Freq: Every day | ORAL | 3 refills | Status: DC

## 2019-09-08 NOTE — Assessment & Plan Note (Signed)
D/c Biotin FT4, TSH in 3 mo

## 2019-09-08 NOTE — Assessment & Plan Note (Signed)
No change 

## 2019-09-08 NOTE — Assessment & Plan Note (Signed)
Re-start B complex °

## 2019-09-08 NOTE — Progress Notes (Signed)
Subjective:  Patient ID: Krystal Gibbs, female    DOB: Jul 25, 1960  Age: 59 y.o. MRN: [id number]  CC: No chief complaint on file.   HPI Sora Stormer presents for hypothyroidism, HTN, anxiety f/u  Outpatient Medications Prior to Visit  Medication Sig Dispense Refill  . ALPRAZolam (XANAX) 0.25 MG tablet Take 1 tablet (0.25 mg total) by mouth 2 (two) times daily as needed for anxiety or sleep. 30 tablet 1  . b complex vitamins tablet Take 1 tablet by mouth daily. 100 tablet 3  . BIOTIN PO Take by mouth daily.    . Cholecalciferol (VITAMIN D PO) Take 1,000 Int'l Units by mouth daily.    Marland Kitchen conjugated estrogens (PREMARIN) vaginal cream Place 1 Applicatorful vaginally 2 (two) times a week. 42.5 g 4  . fluconazole (DIFLUCAN) 150 MG tablet Take one tablet today and repeat one tablet in 5 days.. 2 tablet 0  . hydrochlorothiazide (MICROZIDE) 12.5 MG capsule Take 1 capsule (12.5 mg total) by mouth daily. 90 capsule 3  . levothyroxine (SYNTHROID) 100 MCG tablet Take 1 tablet (100 mcg total) by mouth daily. 90 tablet 3  . losartan (COZAAR) 100 MG tablet Take 1 tablet (100 mg total) by mouth daily. 90 tablet 3  . Multiple Vitamins-Minerals (MULTIVITAMIN PO) Take by mouth daily.    . Omega-3 Fatty Acids (FISH OIL PO) Take by mouth daily.     No facility-administered medications prior to visit.    ROS: Review of Systems  Constitutional: Negative for activity change, appetite change, chills, fatigue and unexpected weight change.  HENT: Negative for congestion, mouth sores and sinus pressure.   Eyes: Negative for visual disturbance.  Respiratory: Negative for cough and chest tightness.   Gastrointestinal: Negative for abdominal pain and nausea.  Genitourinary: Negative for difficulty urinating, frequency and vaginal pain.  Musculoskeletal: Negative for back pain and gait problem.  Skin: Negative for pallor and rash.  Neurological: Negative for dizziness, tremors, weakness, numbness and  headaches.  Psychiatric/Behavioral: Negative for confusion and sleep disturbance.    Objective:  BP (!) 180/90 (BP Location: Left Arm, Patient Position: Sitting, Cuff Size: Large)   Pulse 94   Temp 98.3 F (36.8 C) (Oral)   Ht 5' 2.25" (1.581 m)   Wt 190 lb (86.2 kg)   SpO2 95%   BMI 34.47 kg/m   BP Readings from Last 3 Encounters:  09/08/19 (!) 180/90  06/30/19 138/78  03/15/19 124/82    Wt Readings from Last 3 Encounters:  09/08/19 190 lb (86.2 kg)  06/30/19 195 lb 3.2 oz (88.5 kg)  03/15/19 198 lb (89.8 kg)    Physical Exam Constitutional:      General: She is not in acute distress.    Appearance: She is well-developed.  HENT:     Head: Normocephalic.     Right Ear: External ear normal.     Left Ear: External ear normal.     Nose: Nose normal.  Eyes:     General:        Right eye: No discharge.        Left eye: No discharge.     Conjunctiva/sclera: Conjunctivae normal.     Pupils: Pupils are equal, round, and reactive to light.  Neck:     Thyroid: No thyromegaly.     Vascular: No JVD.     Trachea: No tracheal deviation.  Cardiovascular:     Rate and Rhythm: Normal rate and regular rhythm.     Heart sounds: Normal heart  sounds.  Pulmonary:     Effort: No respiratory distress.     Breath sounds: No stridor. No wheezing.  Abdominal:     General: Bowel sounds are normal. There is no distension.     Palpations: Abdomen is soft. There is no mass.     Tenderness: There is no abdominal tenderness. There is no guarding or rebound.  Musculoskeletal:        General: No tenderness.     Cervical back: Normal range of motion and neck supple.  Lymphadenopathy:     Cervical: No cervical adenopathy.  Skin:    Findings: No erythema or rash.  Neurological:     Cranial Nerves: No cranial nerve deficit.     Motor: No abnormal muscle tone.     Coordination: Coordination normal.     Deep Tendon Reflexes: Reflexes normal.  Psychiatric:        Behavior: Behavior normal.         Thought Content: Thought content normal.        Judgment: Judgment normal.     Lab Results  Component Value Date   WBC 5.6 08/30/2019   HGB 12.9 08/30/2019   HCT 38.6 08/30/2019   PLT 337.0 08/30/2019   GLUCOSE 92 08/30/2019   CHOL 206 (H) 09/24/2017   TRIG 69.0 09/24/2017   HDL 64.70 09/24/2017   LDLDIRECT 133.5 04/28/2013   LDLCALC 128 (H) 09/24/2017   ALT 11 08/30/2019   AST 17 08/30/2019   NA 137 08/30/2019   K 4.4 08/30/2019   CL 105 08/30/2019   CREATININE 0.72 08/30/2019   BUN 12 08/30/2019   CO2 27 08/30/2019   TSH 0.55 08/30/2019   PSA 0.00 (L) 09/24/2017    US Renal  Result Date: 04/13/2019 CLINICAL DATA:  Bilateral flank pain EXAM: RENAL / URINARY TRACT ULTRASOUND COMPLETE COMPARISON:  None. FINDINGS: Right Kidney: Renal measurements: 9.6 x 4.7 x 5.7 cm = volume: 137 mL . Echogenicity within normal limits. No mass or hydronephrosis visualized. Left Kidney: Renal measurements: 10.0 x 5.4 x 5.5 cm = volume: 154 mL. Echogenicity within normal limits. No mass or hydronephrosis visualized. Bladder: Appears normal for degree of bladder distention. Other: None. IMPRESSION: Unremarkable renal ultrasound without evidence of obstructive uropathy. Electronically Signed   By: Davina Poke D.O.   On: 04/13/2019 15:10    Assessment & Plan:   There are no diagnoses linked to this encounter.   No orders of the defined types were placed in this encounter.    Follow-up: No follow-ups on file.  Walker Kehr, MD

## 2019-09-08 NOTE — Assessment & Plan Note (Signed)
Losartan HCT BP nl at home

## 2019-09-08 NOTE — Assessment & Plan Note (Signed)
Vit D 

## 2019-10-06 NOTE — Telephone Encounter (Signed)
No notes needed

## 2019-12-13 ENCOUNTER — Other Ambulatory Visit: Payer: 59

## 2019-12-13 ENCOUNTER — Other Ambulatory Visit: Payer: Self-pay

## 2019-12-13 DIAGNOSIS — G8929 Other chronic pain: Secondary | ICD-10-CM | POA: Diagnosis not present

## 2019-12-13 DIAGNOSIS — I10 Essential (primary) hypertension: Secondary | ICD-10-CM

## 2019-12-13 DIAGNOSIS — E034 Atrophy of thyroid (acquired): Secondary | ICD-10-CM | POA: Diagnosis not present

## 2019-12-13 DIAGNOSIS — R109 Unspecified abdominal pain: Secondary | ICD-10-CM | POA: Diagnosis not present

## 2019-12-13 NOTE — Addendum Note (Signed)
Addended by: Pearson Grippe on: 12/13/2019 08:11 AM   Modules accepted: Orders

## 2019-12-14 LAB — BASIC METABOLIC PANEL
BUN: 11 mg/dL (ref 7–25)
CO2: 29 mmol/L (ref 20–32)
Calcium: 9.4 mg/dL (ref 8.6–10.4)
Chloride: 108 mmol/L (ref 98–110)
Creat: 0.69 mg/dL (ref 0.50–0.99)
Glucose, Bld: 102 mg/dL — ABNORMAL HIGH (ref 65–99)
Potassium: 4.4 mmol/L (ref 3.5–5.3)
Sodium: 142 mmol/L (ref 135–146)

## 2019-12-14 LAB — HEPATIC FUNCTION PANEL
AG Ratio: 1.7 (calc) (ref 1.0–2.5)
ALT: 9 U/L (ref 6–29)
AST: 13 U/L (ref 10–35)
Albumin: 4 g/dL (ref 3.6–5.1)
Alkaline phosphatase (APISO): 83 U/L (ref 37–153)
Bilirubin, Direct: 0.1 mg/dL (ref 0.0–0.2)
Globulin: 2.4 g/dL (calc) (ref 1.9–3.7)
Indirect Bilirubin: 0.2 mg/dL (calc) (ref 0.2–1.2)
Total Bilirubin: 0.3 mg/dL (ref 0.2–1.2)
Total Protein: 6.4 g/dL (ref 6.1–8.1)

## 2019-12-14 LAB — T4, FREE: Free T4: 1.5 ng/dL (ref 0.8–1.8)

## 2019-12-14 LAB — TSH: TSH: 0.41 mIU/L (ref 0.40–4.50)

## 2020-03-13 ENCOUNTER — Ambulatory Visit: Payer: 59 | Admitting: Internal Medicine

## 2020-03-20 ENCOUNTER — Other Ambulatory Visit: Payer: Self-pay | Admitting: Internal Medicine

## 2020-03-20 ENCOUNTER — Ambulatory Visit: Payer: 59 | Admitting: Certified Nurse Midwife

## 2020-03-28 NOTE — Progress Notes (Signed)
60 y.o. G77P1001 Married Black or [place] American Not Hispanic or Latino female here for annual exam.  H/O hysterectomy.  She was using vaginal premarin cream, she ran out. With estrogen and lubrication she doesn't have dyspareunia. No vaginal bleeding. No bowel or bladder c/o.     No LMP recorded. Patient has had a hysterectomy.          Sexually active: Yes.    The current method of family planning is status post hysterectomy.    Exercising: Yes.    walking Smoker:  no  Health Maintenance: Pap:  03-15-2019 negative, HR HPV negative  History of abnormal Pap:  no MMG:  03-16-2019 density A/BIRADS 1 negative, scheduled next week.  BMD:  03/12/19 normal  Colonoscopy: never -- has cologuard kit to do. Hasn't done any screening up until now.  TDaP:  09-23-10 Gardasil: n/a   reports that she has never smoked. She has never used smokeless tobacco. She reports that she does not drink alcohol and does not use drugs. Daughter is 70, in [place] moving back to [place]. No grandchildren. 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   Past Medical History:  Diagnosis Date  . Accidental fall    on or from other stairs or steps  . Allergic rhinitis, cause unspecified   . Cervicalgia   . Chest pain, unspecified   . Contusion of face, scalp, and neck except eye(s)   . Fibroid   . Hypertension 2011  . Hypothyroidism 2011  . Leg pain   . Low back pain   . 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 Pain in limb   . Rash and other nonspecific skin eruption   . Unspecified pruritic disorder   . Unspecified vitamin D deficiency     Past Surgical History:  Procedure Laterality Date  . ABDOMINAL HYSTERECTOMY  1999   TAH, ovaries retained    Current Outpatient Medications  Medication Sig Dispense Refill  . ALPRAZolam (XANAX) 0.25 MG tablet Take 1 tablet (0.25 mg total) by mouth 2 (two) times daily as needed for anxiety or sleep. 30 tablet 1  . b complex vitamins tablet Take  1 tablet by mouth daily. 100 tablet 3  . Cholecalciferol (VITAMIN D PO) Take 1,000 Int'l Units by mouth daily.    . hydrochlorothiazide (MICROZIDE) 12.5 MG capsule Take 1 capsule (12.5 mg total) by mouth daily. 90 capsule 3  . levothyroxine (SYNTHROID) 100 MCG tablet TAKE 1 TABLET (100 MCG TOTAL) BY MOUTH DAILY. 90 tablet 3  . losartan (COZAAR) 100 MG tablet TAKE 1 TABLET BY MOUTH DAILY. 90 tablet 3  . Multiple Vitamins-Minerals (MULTIVITAMIN PO) Take by mouth daily.    . Omega-3 Fatty Acids (FISH OIL PO) Take by mouth daily.    Marland Kitchen conjugated estrogens (PREMARIN) vaginal cream Place 1 Applicatorful vaginally 2 (two) times a week. (Patient not taking: Reported on 03/29/2020) 42.5 g 4   No current facility-administered medications for this visit.    Family History  Problem Relation Age of Onset  . Coronary artery disease Neg Hx   . Thyroid disease Neg Hx   . Early death Mother   . Diabetes Sister   . Diabetes Paternal Uncle     Review of Systems  Constitutional: Negative.   HENT: Negative.   Eyes: Negative.   Respiratory: Negative.   Cardiovascular: Negative.   Gastrointestinal: Negative.   Endocrine: Negative.   Genitourinary: Negative.   Musculoskeletal: Negative.   Skin:  Negative.   Allergic/Immunologic: Negative.   Neurological: Negative.   Hematological: Negative.   Psychiatric/Behavioral: Negative.     Exam:   BP 136/78 (BP Location: Right Arm, Patient Position: Sitting, Cuff Size: Large)   Pulse 80   Resp 16   Ht 5' 2.25" (1.581 m)   Wt 187 lb (84.8 kg)   BMI 33.93 kg/m   Weight change: _0 @ Height:   Height: 5' 2.25" (158.1 cm)  Ht Readings from Last 3 Encounters:  03/29/20 5' 2.25" (1.581 m)  09/08/19 5' 2.25" (1.581 m)  06/30/19 5' 2.25" (1.581 m)    General appearance: alert, cooperative and appears stated age Head: Normocephalic, without obvious abnormality, atraumatic Neck: no adenopathy, supple, symmetrical, trachea midline and thyroid normal  to inspection and palpation Lungs: clear to auscultation bilaterally Cardiovascular: regular rate and rhythm Breasts: normal appearance, no masses or tenderness Abdomen: soft, non-tender; non distended,  no masses,  no organomegaly Extremities: extremities normal, atraumatic, no cyanosis or edema Skin: Skin color, texture, turgor normal. No rashes or lesions Lymph nodes: Cervical, supraclavicular, and axillary nodes normal. No abnormal inguinal nodes palpated Neurologic: Grossly normal   Pelvic: External genitalia:  no lesions              Urethra:  normal appearing urethra with no masses, tenderness or lesions              Bartholins and Skenes: normal                 Vagina: atrophic appearing vagina with normal color and discharge, no lesions              Cervix: absent               Bimanual Exam:  Uterus:  uterus absent              Adnexa: no mass, fullness, tenderness               Rectovaginal: Confirms               Anus:  normal sphincter tone, no lesions  Wandra Scot chaperoned for the exam.  1. Well woman exam with routine gynecological exam H/O hysterectomy No pap needed Has cologuard kit to do (with primary) Mammogram is scheduled Labs with primary Discussed breast self exam Discussed calcium and vit D intake   2. Atrophic vaginitis Needs refill on premarin - conjugated estrogens (PREMARIN) vaginal cream; Place 0.5 grams vaginally 2 x a week at hs.  Dispense: 42.5 g; Refill: 1 -use lubrication with intercourse

## 2020-03-29 ENCOUNTER — Encounter: Payer: Self-pay | Admitting: Obstetrics and Gynecology

## 2020-03-29 ENCOUNTER — Ambulatory Visit (INDEPENDENT_AMBULATORY_CARE_PROVIDER_SITE_OTHER): Payer: 59 | Admitting: Obstetrics and Gynecology

## 2020-03-29 ENCOUNTER — Other Ambulatory Visit: Payer: Self-pay | Admitting: Obstetrics and Gynecology

## 2020-03-29 ENCOUNTER — Other Ambulatory Visit: Payer: Self-pay

## 2020-03-29 VITALS — BP 136/78 | HR 80 | Resp 16 | Ht 62.25 in | Wt 187.0 lb

## 2020-03-29 DIAGNOSIS — Z01419 Encounter for gynecological examination (general) (routine) without abnormal findings: Secondary | ICD-10-CM

## 2020-03-29 DIAGNOSIS — N952 Postmenopausal atrophic vaginitis: Secondary | ICD-10-CM | POA: Diagnosis not present

## 2020-03-29 MED ORDER — ESTROGENS, CONJUGATED 0.625 MG/GM VA CREA: TOPICAL_CREAM | VAGINAL | 1 refills | Status: DC

## 2020-04-17 DIAGNOSIS — Z1231 Encounter for screening mammogram for malignant neoplasm of breast: Secondary | ICD-10-CM | POA: Diagnosis not present

## 2020-05-15 ENCOUNTER — Ambulatory Visit: Payer: 59 | Admitting: Internal Medicine

## 2020-05-31 IMAGING — US US RENAL
1 series · 14 of 25 positions shown · non-contrast
Comparison: None.

CLINICAL DATA: Bilateral flank pain

EXAM:
RENAL / URINARY TRACT ULTRASOUND COMPLETE

[Series 1: us renal · 0.23mm/px · 14 of 32 slices shown]
[im 1/32]
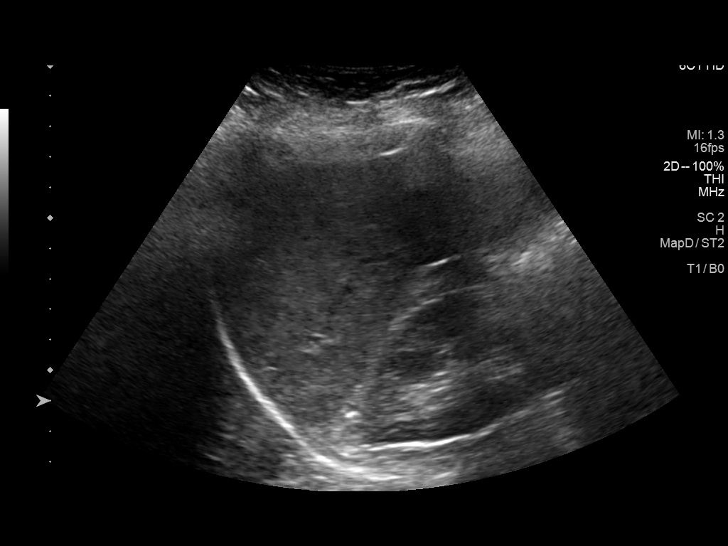
[im 3/32]
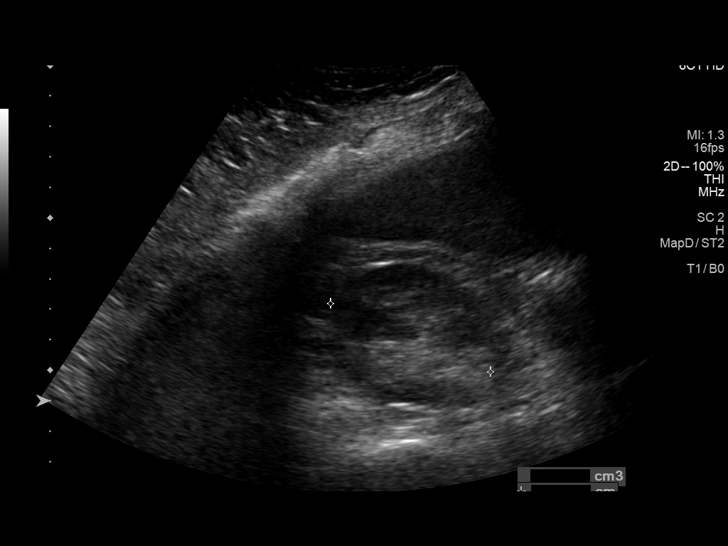
[im 6/32]
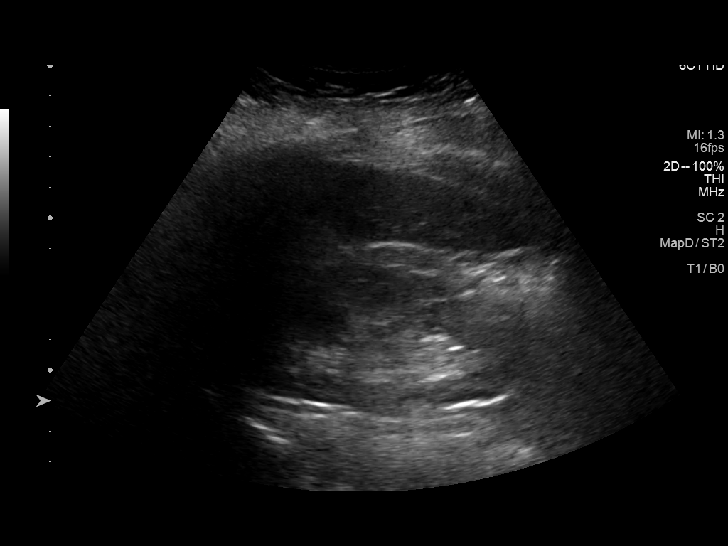
[im 8/32]
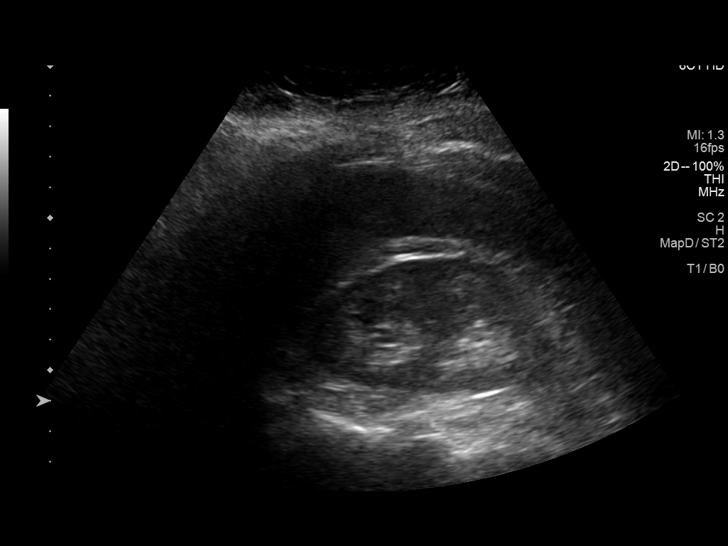
[im 11/32]
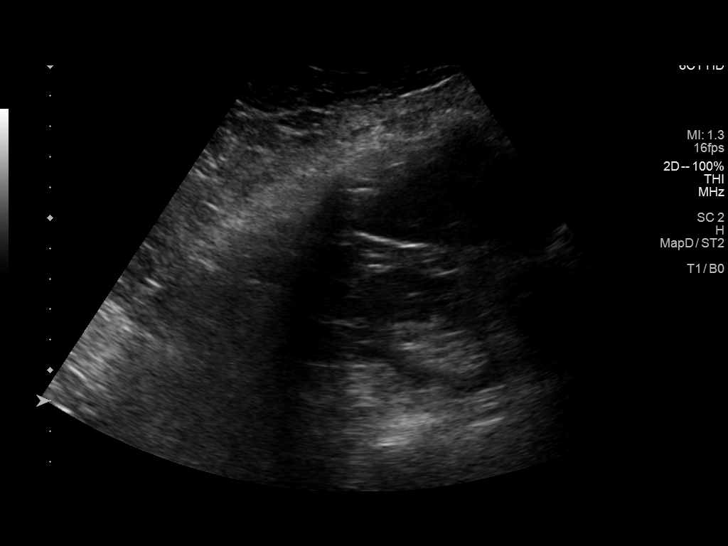
[im 12/32]
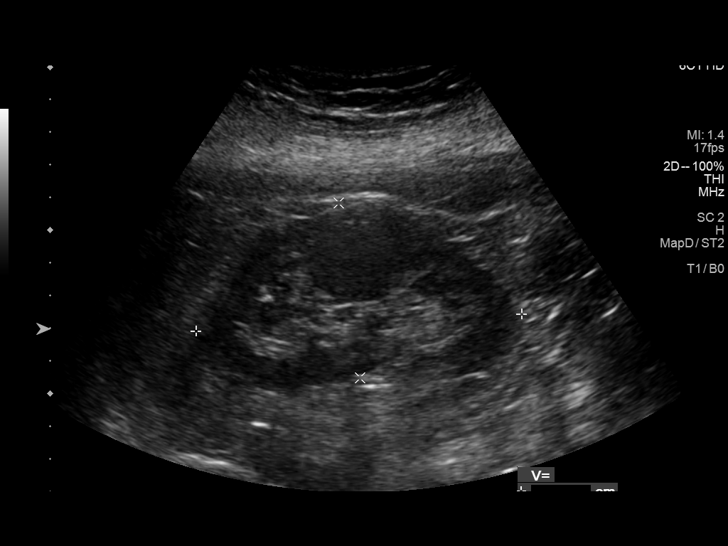
[im 15/32]
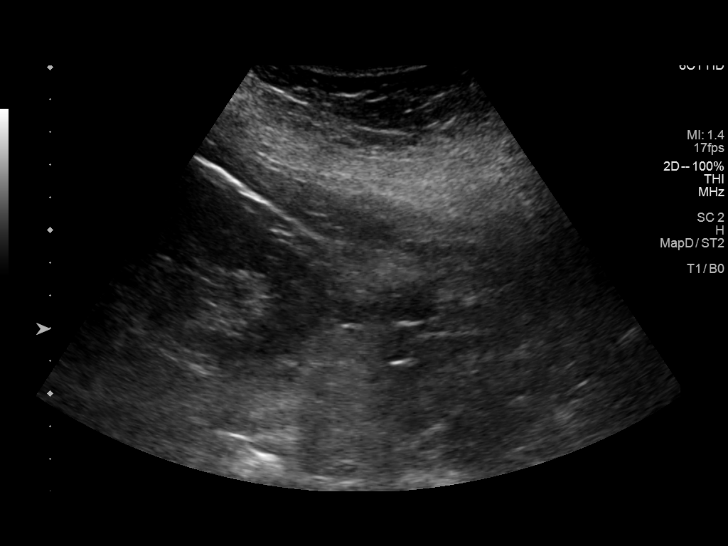
[im 17/32]
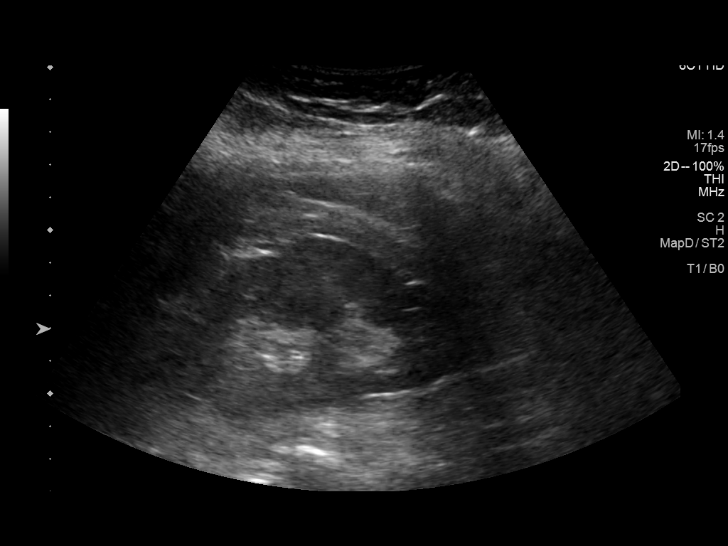
[im 20/32]
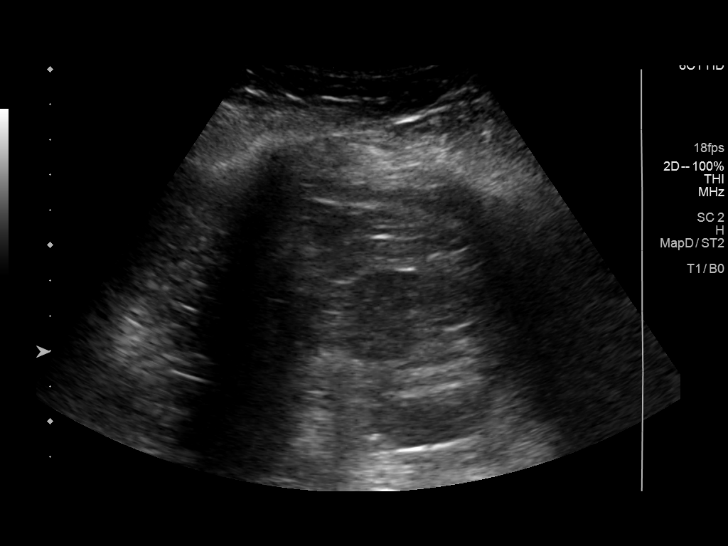
[im 21/32]
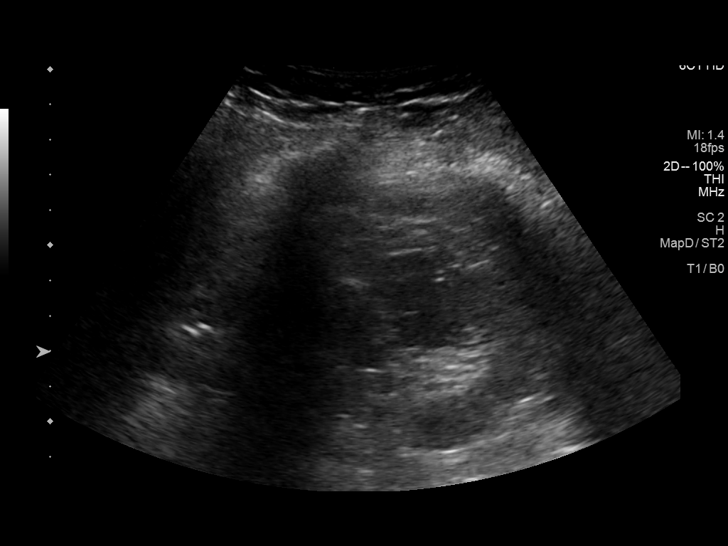
[im 24/32]
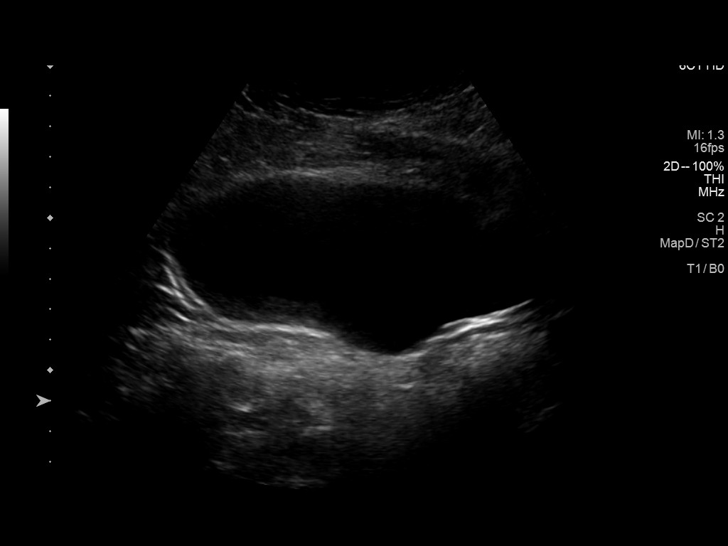
[im 26/32]
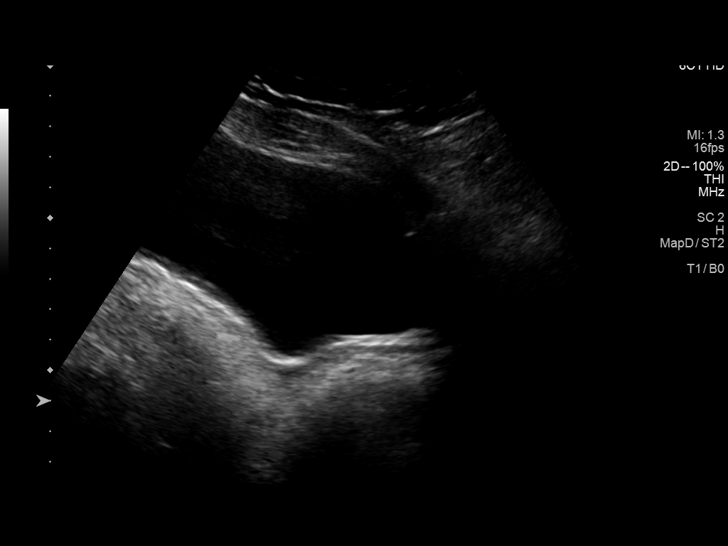
[im 29/32]
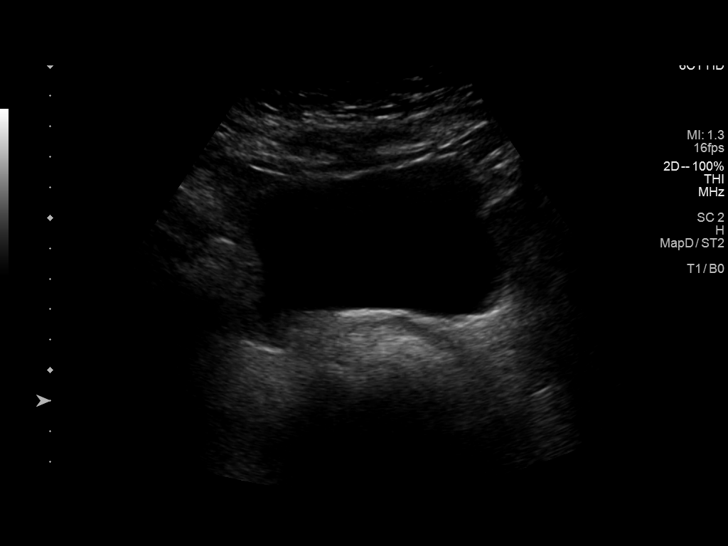
[im 32/32]
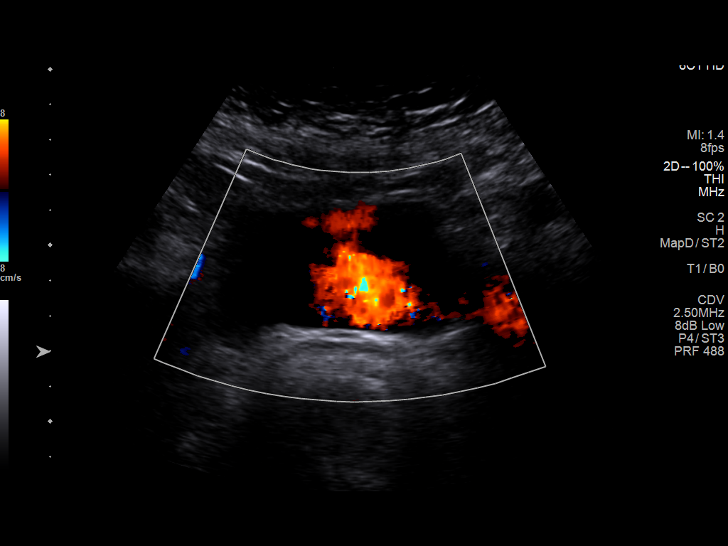

[14 of 25 positions shown; findings below may reference images not displayed]

FINDINGS: Right Kidney:

Renal measurements: 9.6 x 4.7 x 5.7 cm = volume: 137 mL .
Echogenicity within normal limits. No mass or hydronephrosis
visualized.

Left Kidney:

Renal measurements: 10.0 x 5.4 x 5.5 cm = volume: 154 mL.
Echogenicity within normal limits. No mass or hydronephrosis
visualized.

Bladder:

Appears normal for degree of bladder distention.

Other:

None.
IMPRESSION: Unremarkable renal ultrasound without evidence of obstructive
uropathy.

## 2020-06-06 DIAGNOSIS — H52203 Unspecified astigmatism, bilateral: Secondary | ICD-10-CM | POA: Diagnosis not present

## 2020-06-06 DIAGNOSIS — H5203 Hypermetropia, bilateral: Secondary | ICD-10-CM | POA: Diagnosis not present

## 2020-06-06 DIAGNOSIS — H524 Presbyopia: Secondary | ICD-10-CM | POA: Diagnosis not present

## 2020-06-19 ENCOUNTER — Other Ambulatory Visit: Payer: Self-pay

## 2020-06-19 ENCOUNTER — Other Ambulatory Visit: Payer: Self-pay | Admitting: Internal Medicine

## 2020-06-20 ENCOUNTER — Ambulatory Visit: Payer: 59 | Admitting: Internal Medicine

## 2020-06-20 ENCOUNTER — Encounter: Payer: Self-pay | Admitting: Internal Medicine

## 2020-06-20 VITALS — BP 138/80 | HR 69 | Temp 98.1°F | Ht 62.25 in | Wt 184.6 lb

## 2020-06-20 DIAGNOSIS — E034 Atrophy of thyroid (acquired): Secondary | ICD-10-CM | POA: Diagnosis not present

## 2020-06-20 DIAGNOSIS — F341 Dysthymic disorder: Secondary | ICD-10-CM | POA: Diagnosis not present

## 2020-06-20 DIAGNOSIS — E559 Vitamin D deficiency, unspecified: Secondary | ICD-10-CM

## 2020-06-20 DIAGNOSIS — I10 Essential (primary) hypertension: Secondary | ICD-10-CM

## 2020-06-20 DIAGNOSIS — E538 Deficiency of other specified B group vitamins: Secondary | ICD-10-CM | POA: Diagnosis not present

## 2020-06-20 DIAGNOSIS — E785 Hyperlipidemia, unspecified: Secondary | ICD-10-CM | POA: Diagnosis not present

## 2020-06-20 NOTE — Assessment & Plan Note (Signed)
On Vit D 

## 2020-06-20 NOTE — Assessment & Plan Note (Signed)
On B complex 

## 2020-06-20 NOTE — Assessment & Plan Note (Signed)
Doing well 

## 2020-06-20 NOTE — Assessment & Plan Note (Signed)
On Losartan HCT 

## 2020-06-20 NOTE — Progress Notes (Signed)
Subjective:  Patient ID: Krystal Gibbs, female    DOB: Aug 01, 1960  Age: 60 y.o. MRN: [id number]  CC: Follow-up (6 month f/u)   HPI Maddalynn Angert presents for hypertension, dyslipidemia, vitamin D deficiency  Outpatient Medications Prior to Visit  Medication Sig Dispense Refill  . ALPRAZolam (XANAX) 0.25 MG tablet Take 1 tablet (0.25 mg total) by mouth 2 (two) times daily as needed for anxiety or sleep. 30 tablet 1  . b complex vitamins tablet Take 1 tablet by mouth daily. 100 tablet 3  . Cholecalciferol (VITAMIN D PO) Take 1,000 Int'l Units by mouth daily.    Marland Kitchen conjugated estrogens (PREMARIN) vaginal cream Place 0.5 grams vaginally 2 x a week at hs. 42.5 g 1  . hydrochlorothiazide (MICROZIDE) 12.5 MG capsule TAKE 1 CAPSULE (12.5 MG TOTAL) BY MOUTH DAILY. 90 capsule 1  . levothyroxine (SYNTHROID) 100 MCG tablet TAKE 1 TABLET (100 MCG TOTAL) BY MOUTH DAILY. 90 tablet 3  . losartan (COZAAR) 100 MG tablet TAKE 1 TABLET BY MOUTH DAILY. 90 tablet 3  . Multiple Vitamins-Minerals (MULTIVITAMIN PO) Take by mouth daily.    . Omega-3 Fatty Acids (FISH OIL PO) Take by mouth daily.     No facility-administered medications prior to visit.    ROS: Review of Systems  Constitutional: Negative for activity change, appetite change, chills, fatigue and unexpected weight change.  HENT: Negative for congestion, mouth sores and sinus pressure.   Eyes: Negative for visual disturbance.  Respiratory: Negative for cough and chest tightness.   Gastrointestinal: Negative for abdominal pain and nausea.  Genitourinary: Negative for difficulty urinating, frequency and vaginal pain.  Musculoskeletal: Positive for gait problem. Negative for back pain.  Skin: Negative for pallor and rash.  Neurological: Negative for dizziness, tremors, weakness, numbness and headaches.  Psychiatric/Behavioral: Negative for confusion and sleep disturbance.    Objective:  BP 138/80 (BP Location: Left Arm)   Pulse 69    Temp 98.1 F (36.7 C) (Oral)   Ht 5' 2.25" (1.581 m)   Wt 184 lb 9.6 oz (83.7 kg)   SpO2 97%   BMI 33.49 kg/m   BP Readings from Last 3 Encounters:  06/20/20 138/80  03/29/20 136/78  09/08/19 (!) 180/90    Wt Readings from Last 3 Encounters:  06/20/20 184 lb 9.6 oz (83.7 kg)  03/29/20 187 lb (84.8 kg)  09/08/19 190 lb (86.2 kg)    Physical Exam Constitutional:      General: She is not in acute distress.    Appearance: She is well-developed. She is obese.  HENT:     Head: Normocephalic.     Right Ear: External ear normal.     Left Ear: External ear normal.     Nose: Nose normal.  Eyes:     General:        Right eye: No discharge.        Left eye: No discharge.     Conjunctiva/sclera: Conjunctivae normal.     Pupils: Pupils are equal, round, and reactive to light.  Neck:     Thyroid: No thyromegaly.     Vascular: No JVD.     Trachea: No tracheal deviation.  Cardiovascular:     Rate and Rhythm: Normal rate and regular rhythm.     Heart sounds: Normal heart sounds.  Pulmonary:     Effort: No respiratory distress.     Breath sounds: No stridor. No wheezing.  Abdominal:     General: Bowel sounds are normal. There is  no distension.     Palpations: Abdomen is soft. There is no mass.     Tenderness: There is no abdominal tenderness. There is no guarding or rebound.  Musculoskeletal:        General: No tenderness.     Cervical back: Normal range of motion and neck supple.  Lymphadenopathy:     Cervical: No cervical adenopathy.  Skin:    Findings: No erythema or rash.  Neurological:     Mental Status: She is oriented to person, place, and time.     Cranial Nerves: No cranial nerve deficit.     Motor: No abnormal muscle tone.     Coordination: Coordination normal.     Deep Tendon Reflexes: Reflexes normal.  Psychiatric:        Behavior: Behavior normal.        Thought Content: Thought content normal.        Judgment: Judgment normal.     Lab Results   Component Value Date   WBC 5.6 08/30/2019   HGB 12.9 08/30/2019   HCT 38.6 08/30/2019   PLT 337.0 08/30/2019   GLUCOSE 102 (H) 12/13/2019   CHOL 206 (H) 09/24/2017   TRIG 69.0 09/24/2017   HDL 64.70 09/24/2017   LDLDIRECT 133.5 04/28/2013   LDLCALC 128 (H) 09/24/2017   ALT 9 12/13/2019   AST 13 12/13/2019   NA 142 12/13/2019   K 4.4 12/13/2019   CL 108 12/13/2019   CREATININE 0.69 12/13/2019   BUN 11 12/13/2019   CO2 29 12/13/2019   TSH 0.41 12/13/2019   PSA 0.00 (L) 09/24/2017    US Renal  Result Date: 04/13/2019 CLINICAL DATA:  Bilateral flank pain EXAM: RENAL / URINARY TRACT ULTRASOUND COMPLETE COMPARISON:  None. FINDINGS: Right Kidney: Renal measurements: 9.6 x 4.7 x 5.7 cm = volume: 137 mL . Echogenicity within normal limits. No mass or hydronephrosis visualized. Left Kidney: Renal measurements: 10.0 x 5.4 x 5.5 cm = volume: 154 mL. Echogenicity within normal limits. No mass or hydronephrosis visualized. Bladder: Appears normal for degree of bladder distention. Other: None. IMPRESSION: Unremarkable renal ultrasound without evidence of obstructive uropathy. Electronically Signed   By: Davina Poke D.O.   On: 04/13/2019 15:10    Assessment & Plan:   There are no diagnoses linked to this encounter.   No orders of the defined types were placed in this encounter.    Follow-up: No follow-ups on file.  Walker Kehr, MD

## 2020-06-20 NOTE — Assessment & Plan Note (Signed)
Cor calc CT ordered

## 2020-06-27 ENCOUNTER — Other Ambulatory Visit (INDEPENDENT_AMBULATORY_CARE_PROVIDER_SITE_OTHER): Payer: 59

## 2020-06-27 DIAGNOSIS — E785 Hyperlipidemia, unspecified: Secondary | ICD-10-CM | POA: Diagnosis not present

## 2020-06-27 DIAGNOSIS — I10 Essential (primary) hypertension: Secondary | ICD-10-CM | POA: Diagnosis not present

## 2020-06-27 DIAGNOSIS — E034 Atrophy of thyroid (acquired): Secondary | ICD-10-CM

## 2020-06-27 LAB — CBC WITH DIFFERENTIAL/PLATELET
Basophils Absolute: 0 10*3/uL (ref 0.0–0.1)
Basophils Relative: 0.5 % (ref 0.0–3.0)
Eosinophils Absolute: 0.2 10*3/uL (ref 0.0–0.7)
Eosinophils Relative: 3.1 % (ref 0.0–5.0)
HCT: 38.1 % (ref 36.0–46.0)
Hemoglobin: 13 g/dL (ref 12.0–15.0)
Lymphocytes Relative: 40.3 % (ref 12.0–46.0)
Lymphs Abs: 2.1 10*3/uL (ref 0.7–4.0)
MCHC: 34.1 g/dL (ref 30.0–36.0)
MCV: 86 fl (ref 78.0–100.0)
Monocytes Absolute: 0.5 10*3/uL (ref 0.1–1.0)
Monocytes Relative: 9.8 % (ref 3.0–12.0)
Neutro Abs: 2.4 10*3/uL (ref 1.4–7.7)
Neutrophils Relative %: 46.3 % (ref 43.0–77.0)
Platelets: 354 10*3/uL (ref 150.0–400.0)
RBC: 4.43 Mil/uL (ref 3.87–5.11)
RDW: 13.5 % (ref 11.5–15.5)
WBC: 5.3 10*3/uL (ref 4.0–10.5)

## 2020-06-27 LAB — URINALYSIS
Bilirubin Urine: NEGATIVE
Ketones, ur: NEGATIVE
Leukocytes,Ua: NEGATIVE
Nitrite: NEGATIVE
Specific Gravity, Urine: 1.015 (ref 1.000–1.030)
Total Protein, Urine: NEGATIVE
Urine Glucose: NEGATIVE
Urobilinogen, UA: 0.2 (ref 0.0–1.0)
pH: 7 (ref 5.0–8.0)

## 2020-06-27 LAB — COMPREHENSIVE METABOLIC PANEL
ALT: 12 U/L (ref 0–35)
AST: 16 U/L (ref 0–37)
Albumin: 3.9 g/dL (ref 3.5–5.2)
Alkaline Phosphatase: 73 U/L (ref 39–117)
BUN: 12 mg/dL (ref 6–23)
CO2: 31 mEq/L (ref 19–32)
Calcium: 9.3 mg/dL (ref 8.4–10.5)
Chloride: 105 mEq/L (ref 96–112)
Creatinine, Ser: 0.69 mg/dL (ref 0.40–1.20)
GFR: 93.41 mL/min (ref >60.00)
Glucose, Bld: 88 mg/dL (ref 70–99)
Potassium: 4.7 mEq/L (ref 3.5–5.1)
Sodium: 141 mEq/L (ref 135–145)
Total Bilirubin: 0.4 mg/dL (ref 0.2–1.2)
Total Protein: 6.3 g/dL (ref 6.0–8.3)

## 2020-06-27 LAB — LIPID PANEL
Cholesterol: 188 mg/dL (ref 0–200)
HDL: 64.1 mg/dL (ref >39.00)
LDL Cholesterol: 112 mg/dL — ABNORMAL HIGH (ref 0–99)
NonHDL: 123.68
Total CHOL/HDL Ratio: 3
Triglycerides: 57 mg/dL (ref 0.0–149.0)
VLDL: 11.4 mg/dL (ref 0.0–40.0)

## 2020-06-27 LAB — T4, FREE: Free T4: 1.18 ng/dL (ref 0.60–1.60)

## 2020-06-27 LAB — TSH: TSH: 1.06 u[IU]/mL (ref 0.35–4.50)

## 2020-06-28 ENCOUNTER — Telehealth: Payer: Self-pay | Admitting: Internal Medicine

## 2020-06-28 NOTE — Telephone Encounter (Signed)
Is there any way you could send out one of the advance directive packets to this patient? Its the address on file.

## 2020-06-28 NOTE — Telephone Encounter (Signed)
Paperwork mailed to address on file.

## 2020-07-13 ENCOUNTER — Inpatient Hospital Stay: Admission: RE | Admit: 2020-07-13 | Payer: 59 | Source: Ambulatory Visit

## 2020-07-18 ENCOUNTER — Other Ambulatory Visit (HOSPITAL_COMMUNITY): Payer: Self-pay

## 2020-07-18 MED FILL — Losartan Potassium Tab 100 MG: ORAL | 90 days supply | Qty: 90 | Fill #0 | Status: AC

## 2020-08-16 ENCOUNTER — Other Ambulatory Visit: Payer: 59

## 2020-09-05 ENCOUNTER — Other Ambulatory Visit: Payer: 59

## 2020-09-21 ENCOUNTER — Other Ambulatory Visit: Payer: 59

## 2020-09-26 ENCOUNTER — Other Ambulatory Visit (HOSPITAL_COMMUNITY): Payer: Self-pay

## 2020-09-26 MED FILL — Hydrochlorothiazide Cap 12.5 MG: ORAL | 90 days supply | Qty: 90 | Fill #0 | Status: AC

## 2020-10-05 ENCOUNTER — Other Ambulatory Visit: Payer: Self-pay | Admitting: Internal Medicine

## 2020-10-05 ENCOUNTER — Other Ambulatory Visit (HOSPITAL_COMMUNITY): Payer: Self-pay

## 2020-10-05 MED ORDER — TRIAMCINOLONE ACETONIDE 0.1 % EX CREA
1.0000 "application " | TOPICAL_CREAM | Freq: Four times a day (QID) | CUTANEOUS | 1 refills | Status: AC
  Filled 2020-10-05: qty 160, 30d supply, fill #0
  Filled 2021-09-22: qty 160, 30d supply, fill #1

## 2020-10-10 ENCOUNTER — Other Ambulatory Visit (HOSPITAL_COMMUNITY): Payer: Self-pay

## 2020-10-10 MED FILL — Losartan Potassium Tab 100 MG: ORAL | 90 days supply | Qty: 90 | Fill #1 | Status: AC

## 2020-10-10 MED FILL — Levothyroxine Sodium Tab 100 MCG: ORAL | 90 days supply | Qty: 90 | Fill #0 | Status: AC

## 2020-10-16 ENCOUNTER — Other Ambulatory Visit: Payer: Self-pay

## 2020-10-16 ENCOUNTER — Ambulatory Visit
Admission: RE | Admit: 2020-10-16 | Discharge: 2020-10-16 | Disposition: A | Discharge status: Home / Self Care | Payer: 59 | Source: Ambulatory Visit | Attending: Internal Medicine | Admitting: Internal Medicine

## 2020-10-16 DIAGNOSIS — E785 Hyperlipidemia, unspecified: Secondary | ICD-10-CM

## 2020-10-16 DIAGNOSIS — I10 Essential (primary) hypertension: Secondary | ICD-10-CM

## 2020-12-26 ENCOUNTER — Ambulatory Visit (INDEPENDENT_AMBULATORY_CARE_PROVIDER_SITE_OTHER): Payer: 59 | Admitting: Internal Medicine

## 2020-12-26 ENCOUNTER — Encounter: Payer: Self-pay | Admitting: Internal Medicine

## 2020-12-26 ENCOUNTER — Other Ambulatory Visit: Payer: Self-pay

## 2020-12-26 ENCOUNTER — Other Ambulatory Visit (HOSPITAL_COMMUNITY): Payer: Self-pay

## 2020-12-26 VITALS — BP 128/78 | HR 80 | Temp 98.2°F | Ht 62.25 in | Wt 184.2 lb

## 2020-12-26 DIAGNOSIS — E785 Hyperlipidemia, unspecified: Secondary | ICD-10-CM

## 2020-12-26 DIAGNOSIS — Z0000 Encounter for general adult medical examination without abnormal findings: Secondary | ICD-10-CM

## 2020-12-26 DIAGNOSIS — E538 Deficiency of other specified B group vitamins: Secondary | ICD-10-CM

## 2020-12-26 DIAGNOSIS — E034 Atrophy of thyroid (acquired): Secondary | ICD-10-CM

## 2020-12-26 DIAGNOSIS — I10 Essential (primary) hypertension: Secondary | ICD-10-CM

## 2020-12-26 LAB — LIPID PANEL
Cholesterol: 205 mg/dL — ABNORMAL HIGH (ref 0–200)
HDL: 65.6 mg/dL (ref >39.00)
LDL Cholesterol: 124 mg/dL — ABNORMAL HIGH (ref 0–99)
NonHDL: 139.46
Total CHOL/HDL Ratio: 3
Triglycerides: 76 mg/dL (ref 0.0–149.0)
VLDL: 15.2 mg/dL (ref 0.0–40.0)

## 2020-12-26 LAB — CBC WITH DIFFERENTIAL/PLATELET
Basophils Absolute: 0 10*3/uL (ref 0.0–0.1)
Basophils Relative: 0.5 % (ref 0.0–3.0)
Eosinophils Absolute: 0.1 10*3/uL (ref 0.0–0.7)
Eosinophils Relative: 2.1 % (ref 0.0–5.0)
HCT: 39.4 % (ref 36.0–46.0)
Hemoglobin: 13.1 g/dL (ref 12.0–15.0)
Lymphocytes Relative: 35 % (ref 12.0–46.0)
Lymphs Abs: 1.7 10*3/uL (ref 0.7–4.0)
MCHC: 33.4 g/dL (ref 30.0–36.0)
MCV: 87.1 fl (ref 78.0–100.0)
Monocytes Absolute: 0.5 10*3/uL (ref 0.1–1.0)
Monocytes Relative: 11.2 % (ref 3.0–12.0)
Neutro Abs: 2.4 10*3/uL (ref 1.4–7.7)
Neutrophils Relative %: 51.2 % (ref 43.0–77.0)
Platelets: 355 10*3/uL (ref 150.0–400.0)
RBC: 4.52 Mil/uL (ref 3.87–5.11)
RDW: 13.4 % (ref 11.5–15.5)
WBC: 4.7 10*3/uL (ref 4.0–10.5)

## 2020-12-26 LAB — COMPREHENSIVE METABOLIC PANEL
ALT: 10 U/L (ref 0–35)
AST: 20 U/L (ref 0–37)
Albumin: 4.2 g/dL (ref 3.5–5.2)
Alkaline Phosphatase: 71 U/L (ref 39–117)
BUN: 13 mg/dL (ref 6–23)
CO2: 29 mEq/L (ref 19–32)
Calcium: 9.3 mg/dL (ref 8.4–10.5)
Chloride: 105 mEq/L (ref 96–112)
Creatinine, Ser: 0.67 mg/dL (ref 0.40–1.20)
GFR: 93.75 mL/min (ref >60.00)
Glucose, Bld: 87 mg/dL (ref 70–99)
Potassium: 3.7 mEq/L (ref 3.5–5.1)
Sodium: 140 mEq/L (ref 135–145)
Total Bilirubin: 0.6 mg/dL (ref 0.2–1.2)
Total Protein: 6.9 g/dL (ref 6.0–8.3)

## 2020-12-26 LAB — TSH: TSH: 0.62 u[IU]/mL (ref 0.35–5.50)

## 2020-12-26 LAB — URINALYSIS
Bilirubin Urine: NEGATIVE
Ketones, ur: NEGATIVE
Leukocytes,Ua: NEGATIVE
Nitrite: NEGATIVE
Specific Gravity, Urine: 1.01 (ref 1.000–1.030)
Total Protein, Urine: NEGATIVE
Urine Glucose: NEGATIVE
Urobilinogen, UA: 0.2 (ref 0.0–1.0)
pH: 6.5 (ref 5.0–8.0)

## 2020-12-26 LAB — T4, FREE: Free T4: 1.23 ng/dL (ref 0.60–1.60)

## 2020-12-26 LAB — VITAMIN B12: Vitamin B-12: 310 pg/mL (ref 211–911)

## 2020-12-26 MED ORDER — LOSARTAN POTASSIUM 100 MG PO TABS
100.0000 mg | ORAL_TABLET | Freq: Every day | ORAL | 3 refills | Status: DC
  Filled 2020-12-26: qty 90, 90d supply, fill #0
  Filled 2021-03-30: qty 90, 90d supply, fill #1
  Filled 2021-06-27: qty 90, 90d supply, fill #2
  Filled 2021-09-22: qty 90, 90d supply, fill #3

## 2020-12-26 MED ORDER — LEVOTHYROXINE SODIUM 100 MCG PO TABS
100.0000 ug | ORAL_TABLET | Freq: Every day | ORAL | 3 refills | Status: DC
  Filled 2020-12-26: qty 90, 90d supply, fill #0
  Filled 2021-03-30: qty 90, 90d supply, fill #1
  Filled 2021-06-27: qty 90, 90d supply, fill #2
  Filled 2021-09-22 – 2021-09-24 (×2): qty 90, 90d supply, fill #3

## 2020-12-26 MED ORDER — HYDROCHLOROTHIAZIDE 12.5 MG PO CAPS
12.5000 mg | ORAL_CAPSULE | Freq: Every day | ORAL | 3 refills | Status: DC
  Filled 2020-12-26: qty 90, 90d supply, fill #0
  Filled 2021-03-30: qty 90, 90d supply, fill #1
  Filled 2021-06-27: qty 90, 90d supply, fill #2
  Filled 2021-09-22: qty 90, 90d supply, fill #3

## 2020-12-26 NOTE — Assessment & Plan Note (Addendum)
  We discussed age appropriate health related issues, including available/recomended screening tests and vaccinations. Labs were ordered to be later reviewed . All questions were answered. We discussed one or more of the following - seat belt use, use of sunscreen/sun exposure exercise, fall risk reduction, second hand smoke exposure, firearm use and storage, seat belt use, a need for adhering to healthy diet and exercise. Labs were ordered.  All questions were answered. 2022 CT IMPRESSION: No visible coronary artery calcifications. Total coronary calcium score of 0.

## 2020-12-26 NOTE — Assessment & Plan Note (Signed)
CT IMPRESSION: No visible coronary artery calcifications. Total coronary calcium score of 0.

## 2020-12-26 NOTE — Assessment & Plan Note (Signed)
On Hyzaar 

## 2020-12-26 NOTE — Assessment & Plan Note (Signed)
Cont on B complex

## 2020-12-26 NOTE — Assessment & Plan Note (Signed)
On Levothroid 

## 2020-12-26 NOTE — Progress Notes (Signed)
Subjective:  Patient ID: Krystal Gibbs, female    DOB: Aug 20, 1960  Age: 60 y.o. MRN: [id number]  CC: Annual Exam   HPI [name] presents for a well exam  Outpatient Medications Prior to Visit  Medication Sig Dispense Refill   b complex vitamins tablet Take 1 tablet by mouth daily. 100 tablet 3   Cholecalciferol (VITAMIN D PO) Take 1,000 Int'l Units by mouth daily.     conjugated estrogens (PREMARIN) vaginal cream PLACE 0.5 GRAM VAGINALLY 2 TIMES A WEEK AT BEDTIME 30 g 1   triamcinolone cream (KENALOG) 0.1 % Apply 1 application topically 4 (four) times daily. 160 g 1   hydrochlorothiazide (MICROZIDE) 12.5 MG capsule TAKE 1 CAPSULE (12.5 MG TOTAL) BY MOUTH DAILY. 90 capsule 1   levothyroxine (SYNTHROID) 100 MCG tablet TAKE 1 TABLET (100 MCG TOTAL) BY MOUTH DAILY. 90 tablet 3   losartan (COZAAR) 100 MG tablet TAKE 1 TABLET BY MOUTH DAILY. 90 tablet 3   ALPRAZolam (XANAX) 0.25 MG tablet Take 1 tablet (0.25 mg total) by mouth 2 (two) times daily as needed for anxiety or sleep. (Patient not taking: Reported on 12/26/2020) 30 tablet 1   Multiple Vitamins-Minerals (MULTIVITAMIN PO) Take by mouth daily. (Patient not taking: Reported on 12/26/2020)     Omega-3 Fatty Acids (FISH OIL PO) Take by mouth daily. (Patient not taking: Reported on 12/26/2020)     No facility-administered medications prior to visit.    ROS: Review of Systems  Constitutional:  Negative for activity change, appetite change, chills, fatigue and unexpected weight change.  HENT:  Negative for congestion, mouth sores and sinus pressure.   Eyes:  Negative for visual disturbance.  Respiratory:  Negative for cough and chest tightness.   Gastrointestinal:  Negative for abdominal pain and nausea.  Genitourinary:  Negative for difficulty urinating, frequency and vaginal pain.  Musculoskeletal:  Positive for arthralgias and back pain. Negative for gait problem.  Skin:  Negative for pallor and rash.  Neurological:   Negative for dizziness, tremors, weakness, numbness and headaches.  Psychiatric/Behavioral:  Negative for confusion, sleep disturbance and suicidal ideas.    Objective:  BP 128/78 (BP Location: Left Arm)   Pulse 80   Temp 98.2 F (36.8 C) (Oral)   Ht 5' 2.25" (1.581 m)   Wt 184 lb 3.2 oz (83.6 kg)   SpO2 97%   BMI 33.42 kg/m   BP Readings from Last 3 Encounters:  12/26/20 128/78  06/20/20 138/80  03/29/20 136/78    Wt Readings from Last 3 Encounters:  12/26/20 184 lb 3.2 oz (83.6 kg)  06/20/20 184 lb 9.6 oz (83.7 kg)  03/29/20 187 lb (84.8 kg)    Physical Exam Constitutional:      General: She is not in acute distress.    Appearance: She is well-developed.  HENT:     Head: Normocephalic.     Right Ear: External ear normal.     Left Ear: External ear normal.     Nose: Nose normal.  Eyes:     General:        Right eye: No discharge.        Left eye: No discharge.     Conjunctiva/sclera: Conjunctivae normal.     Pupils: Pupils are equal, round, and reactive to light.  Neck:     Thyroid: No thyromegaly.     Vascular: No JVD.     Trachea: No tracheal deviation.  Cardiovascular:     Rate and Rhythm: Normal rate and  regular rhythm.     Heart sounds: Normal heart sounds.  Pulmonary:     Effort: No respiratory distress.     Breath sounds: No stridor. No wheezing.  Abdominal:     General: Bowel sounds are normal. There is no distension.     Palpations: Abdomen is soft. There is no mass.     Tenderness: There is no abdominal tenderness. There is no guarding or rebound.  Musculoskeletal:        General: No tenderness.     Cervical back: Normal range of motion and neck supple. No rigidity.  Lymphadenopathy:     Cervical: No cervical adenopathy.  Skin:    Findings: No erythema or rash.  Neurological:     Cranial Nerves: No cranial nerve deficit.     Motor: No abnormal muscle tone.     Coordination: Coordination normal.     Deep Tendon Reflexes: Reflexes normal.   Psychiatric:        Behavior: Behavior normal.        Thought Content: Thought content normal.        Judgment: Judgment normal.    Lab Results  Component Value Date   WBC 5.3 06/27/2020   HGB 13.0 06/27/2020   HCT 38.1 06/27/2020   PLT 354.0 06/27/2020   GLUCOSE 88 06/27/2020   CHOL 188 06/27/2020   TRIG 57.0 06/27/2020   HDL 64.10 06/27/2020   LDLDIRECT 133.5 04/28/2013   LDLCALC 112 (H) 06/27/2020   ALT 12 06/27/2020   AST 16 06/27/2020   NA 141 06/27/2020   K 4.7 06/27/2020   CL 105 06/27/2020   CREATININE 0.69 06/27/2020   BUN 12 06/27/2020   CO2 31 06/27/2020   TSH 1.06 06/27/2020   PSA 0.00 (L) 09/24/2017    CT CARDIAC SCORING ([facility] LOCATIONS ONLY)  Result Date: 10/16/2020 CLINICAL DATA:  Dyslipidemia.  Essential hypertension. EXAM: CT CARDIAC CORONARY ARTERY CALCIUM SCORE TECHNIQUE: Non-contrast imaging through the heart was performed using prospective ECG gating. Image post processing was performed on an independent workstation, allowing for quantitative analysis of the heart and coronary arteries. Note that this exam targets the heart and the chest was not imaged in its entirety. COMPARISON:  01/15/2005 FINDINGS: CORONARY CALCIUM SCORES: Left Main: 0 LAD: 0 LCx: 0 RCA: 0 Total Agatston Score: 0 MESA database percentile: 0 AORTA MEASUREMENTS: Ascending Aorta: 34 mm Descending Aorta: 27 mm OTHER FINDINGS: Heart is normal size. Aorta normal caliber. No adenopathy. Small subpleural nodule in the left lower lobe on image 48 measures 3 mm, new since prior study. No confluent opacities or effusions. Imaging into the upper abdomen demonstrates no acute findings. Chest wall soft tissues are unremarkable. No acute bony abnormality. IMPRESSION: No visible coronary artery calcifications. Total coronary calcium score of 0. 3 mm subpleural left lower lobe pulmonary nodule. No follow-up needed if patient is low-risk. Non-contrast chest CT can be considered in 12 months if patient is  high-risk. This recommendation follows the consensus statement: Guidelines for Management of Incidental Pulmonary Nodules Detected on CT Images: From the Fleischner Society 2017; Radiology 2017; 284:228-243. Electronically Signed   By: Rolm Baptise M.D.   On: 10/16/2020 12:53    Assessment & Plan:   Problem List Items Addressed This Visit     B12 deficiency    Cont on B complex      Dyslipidemia    CT IMPRESSION: No visible coronary artery calcifications. Total coronary calcium score of 0.      Essential  hypertension    On Hyzaar      Relevant Medications   hydrochlorothiazide (MICROZIDE) 12.5 MG capsule   losartan (COZAAR) 100 MG tablet   Hypothyroidism    On Levothroid      Relevant Medications   levothyroxine (SYNTHROID) 100 MCG tablet   Well adult exam     We discussed age appropriate health related issues, including available/recomended screening tests and vaccinations. Labs were ordered to be later reviewed . All questions were answered. We discussed one or more of the following - seat belt use, use of sunscreen/sun exposure exercise, fall risk reduction, second hand smoke exposure, firearm use and storage, seat belt use, a need for adhering to healthy diet and exercise. Labs were ordered.  All questions were answered. 2022 CT IMPRESSION: No visible coronary artery calcifications. Total coronary calcium score of 0.          Follow-up: Return in about 1 year (around 12/26/2021) for Wellness Exam.  Walker Kehr, MD

## 2021-03-30 ENCOUNTER — Other Ambulatory Visit (HOSPITAL_COMMUNITY): Payer: Self-pay

## 2021-04-24 DIAGNOSIS — Z1231 Encounter for screening mammogram for malignant neoplasm of breast: Secondary | ICD-10-CM | POA: Diagnosis not present

## 2021-04-24 LAB — HM MAMMOGRAPHY

## 2021-05-10 ENCOUNTER — Encounter: Payer: Self-pay | Admitting: Internal Medicine

## 2021-06-27 ENCOUNTER — Other Ambulatory Visit (HOSPITAL_COMMUNITY): Payer: Self-pay

## 2021-09-12 DIAGNOSIS — H5203 Hypermetropia, bilateral: Secondary | ICD-10-CM | POA: Diagnosis not present

## 2021-09-24 ENCOUNTER — Other Ambulatory Visit (HOSPITAL_COMMUNITY): Payer: Self-pay

## 2021-12-26 ENCOUNTER — Ambulatory Visit (INDEPENDENT_AMBULATORY_CARE_PROVIDER_SITE_OTHER): Payer: 59 | Admitting: Internal Medicine

## 2021-12-26 ENCOUNTER — Encounter: Payer: Self-pay | Admitting: Internal Medicine

## 2021-12-26 ENCOUNTER — Other Ambulatory Visit (HOSPITAL_COMMUNITY): Payer: Self-pay

## 2021-12-26 VITALS — BP 128/78 | HR 80 | Temp 98.5°F | Ht 62.25 in | Wt 185.4 lb

## 2021-12-26 DIAGNOSIS — Z1211 Encounter for screening for malignant neoplasm of colon: Secondary | ICD-10-CM

## 2021-12-26 DIAGNOSIS — Z0000 Encounter for general adult medical examination without abnormal findings: Secondary | ICD-10-CM | POA: Diagnosis not present

## 2021-12-26 DIAGNOSIS — E538 Deficiency of other specified B group vitamins: Secondary | ICD-10-CM

## 2021-12-26 DIAGNOSIS — E0590 Thyrotoxicosis, unspecified without thyrotoxic crisis or storm: Secondary | ICD-10-CM | POA: Diagnosis not present

## 2021-12-26 DIAGNOSIS — I10 Essential (primary) hypertension: Secondary | ICD-10-CM | POA: Diagnosis not present

## 2021-12-26 DIAGNOSIS — E559 Vitamin D deficiency, unspecified: Secondary | ICD-10-CM | POA: Diagnosis not present

## 2021-12-26 LAB — COMPREHENSIVE METABOLIC PANEL
ALT: 11 U/L (ref 0–35)
AST: 17 U/L (ref 0–37)
Albumin: 4.1 g/dL (ref 3.5–5.2)
Alkaline Phosphatase: 67 U/L (ref 39–117)
BUN: 11 mg/dL (ref 6–23)
CO2: 28 mEq/L (ref 19–32)
Calcium: 9.5 mg/dL (ref 8.4–10.5)
Chloride: 104 mEq/L (ref 96–112)
Creatinine, Ser: 0.75 mg/dL (ref 0.40–1.20)
GFR: 84.79 mL/min (ref >60.00)
Glucose, Bld: 94 mg/dL (ref 70–99)
Potassium: 3.9 mEq/L (ref 3.5–5.1)
Sodium: 140 mEq/L (ref 135–145)
Total Bilirubin: 0.6 mg/dL (ref 0.2–1.2)
Total Protein: 7.2 g/dL (ref 6.0–8.3)

## 2021-12-26 LAB — LIPID PANEL
Cholesterol: 188 mg/dL (ref 0–200)
HDL: 57.3 mg/dL (ref >39.00)
LDL Cholesterol: 118 mg/dL — ABNORMAL HIGH (ref 0–99)
NonHDL: 131.18
Total CHOL/HDL Ratio: 3
Triglycerides: 68 mg/dL (ref 0.0–149.0)
VLDL: 13.6 mg/dL (ref 0.0–40.0)

## 2021-12-26 LAB — URINALYSIS
Bilirubin Urine: NEGATIVE
Hgb urine dipstick: NEGATIVE
Ketones, ur: NEGATIVE
Leukocytes,Ua: NEGATIVE
Nitrite: NEGATIVE
Specific Gravity, Urine: <=1.005 — AB (ref 1.000–1.030)
Total Protein, Urine: NEGATIVE
Urine Glucose: NEGATIVE
Urobilinogen, UA: 0.2 (ref 0.0–1.0)
pH: 7 (ref 5.0–8.0)

## 2021-12-26 LAB — CBC WITH DIFFERENTIAL/PLATELET
Basophils Absolute: 0 10*3/uL (ref 0.0–0.1)
Basophils Relative: 0.3 % (ref 0.0–3.0)
Eosinophils Absolute: 0.2 10*3/uL (ref 0.0–0.7)
Eosinophils Relative: 3.6 % (ref 0.0–5.0)
HCT: 37.3 % (ref 36.0–46.0)
Hemoglobin: 12.8 g/dL (ref 12.0–15.0)
Lymphocytes Relative: 31 % (ref 12.0–46.0)
Lymphs Abs: 1.6 10*3/uL (ref 0.7–4.0)
MCHC: 34.2 g/dL (ref 30.0–36.0)
MCV: 85.6 fl (ref 78.0–100.0)
Monocytes Absolute: 0.8 10*3/uL (ref 0.1–1.0)
Monocytes Relative: 15.2 % — ABNORMAL HIGH (ref 3.0–12.0)
Neutro Abs: 2.6 10*3/uL (ref 1.4–7.7)
Neutrophils Relative %: 49.9 % (ref 43.0–77.0)
Platelets: 326 10*3/uL (ref 150.0–400.0)
RBC: 4.36 Mil/uL (ref 3.87–5.11)
RDW: 13.2 % (ref 11.5–15.5)
WBC: 5.3 10*3/uL (ref 4.0–10.5)

## 2021-12-26 LAB — TSH: TSH: 0.74 u[IU]/mL (ref 0.35–5.50)

## 2021-12-26 LAB — VITAMIN B12: Vitamin B-12: 397 pg/mL (ref 211–911)

## 2021-12-26 LAB — T4, FREE: Free T4: 1.47 ng/dL (ref 0.60–1.60)

## 2021-12-26 LAB — VITAMIN D 25 HYDROXY (VIT D DEFICIENCY, FRACTURES): VITD: 40.44 ng/mL (ref 30.00–100.00)

## 2021-12-26 MED ORDER — LEVOTHYROXINE SODIUM 100 MCG PO TABS
100.0000 ug | ORAL_TABLET | Freq: Every day | ORAL | 3 refills | Status: DC
  Filled 2021-12-26: qty 90, 90d supply, fill #0
  Filled 2022-03-23: qty 90, 90d supply, fill #1
  Filled 2022-06-21: qty 90, 90d supply, fill #2
  Filled 2022-09-19: qty 90, 90d supply, fill #3

## 2021-12-26 MED ORDER — HYDROCHLOROTHIAZIDE 12.5 MG PO CAPS
12.5000 mg | ORAL_CAPSULE | Freq: Every day | ORAL | 3 refills | Status: DC
  Filled 2021-12-26: qty 90, 90d supply, fill #0
  Filled 2022-03-23: qty 90, 90d supply, fill #1
  Filled 2022-06-21: qty 90, 90d supply, fill #2
  Filled 2022-09-19: qty 90, 90d supply, fill #3

## 2021-12-26 MED ORDER — LOSARTAN POTASSIUM 100 MG PO TABS
100.0000 mg | ORAL_TABLET | Freq: Every day | ORAL | 3 refills | Status: DC
  Filled 2021-12-26: qty 90, 90d supply, fill #0
  Filled 2022-03-23: qty 90, 90d supply, fill #1
  Filled 2022-06-21: qty 90, 90d supply, fill #2
  Filled 2022-09-19: qty 90, 90d supply, fill #3

## 2021-12-26 NOTE — Assessment & Plan Note (Addendum)
We discussed age appropriate health related issues, including available/recomended screening tests and vaccinations. We discussed a need for adhering to healthy diet and exercise. Labs/EKG were reviewed/ordered. All questions were answered.  2022 CT IMPRESSION: No visible coronary artery calcifications. Total coronary calcium score of 0.  Cologuard ordered

## 2021-12-26 NOTE — Assessment & Plan Note (Signed)
  BP nl at home 

## 2021-12-26 NOTE — Assessment & Plan Note (Signed)
Check TSH, FT4 

## 2021-12-26 NOTE — Assessment & Plan Note (Signed)
Check Vit D level 

## 2021-12-26 NOTE — Assessment & Plan Note (Signed)
Check B12 

## 2021-12-26 NOTE — Progress Notes (Signed)
Subjective:  Patient ID: Krystal Gibbs, female    DOB: October 13, 1960  Age: 61 y.o. MRN: [id number]  CC: Annual Exam   HPI Krystal Gibbs presents for a well exam  Outpatient Medications Prior to Visit  Medication Sig Dispense Refill   b complex vitamins tablet Take 1 tablet by mouth daily. 100 tablet 3   Cholecalciferol (VITAMIN D PO) Take 1,000 Int'l Units by mouth daily.     triamcinolone cream (KENALOG) 0.1 % Apply 1 application topically 4 (four) times daily. 160 g 1   hydrochlorothiazide (MICROZIDE) 12.5 MG capsule Take 1 capsule (12.5 mg total) by mouth daily. 90 capsule 3   levothyroxine (SYNTHROID) 100 MCG tablet Take 1 tablet (100 mcg total) by mouth daily. 90 tablet 3   losartan (COZAAR) 100 MG tablet Take 1 tablet (100 mg total) by mouth daily. 90 tablet 3   No facility-administered medications prior to visit.    ROS: Review of Systems  Constitutional:  Negative for activity change, appetite change, chills, fatigue and unexpected weight change.  HENT:  Negative for congestion, mouth sores and sinus pressure.   Eyes:  Negative for visual disturbance.  Respiratory:  Negative for cough and chest tightness.   Gastrointestinal:  Negative for abdominal pain and nausea.  Genitourinary:  Negative for difficulty urinating, frequency and vaginal pain.  Musculoskeletal:  Negative for back pain and gait problem.  Skin:  Negative for pallor and rash.  Neurological:  Negative for dizziness, tremors, weakness, numbness and headaches.  Psychiatric/Behavioral:  Negative for confusion and sleep disturbance.     Objective:  BP 128/78 (BP Location: Left Arm)   Pulse 80   Temp 98.5 F (36.9 C) (Oral)   Ht 5' 2.25" (1.581 m)   Wt 185 lb 6.4 oz (84.1 kg)   SpO2 97%   BMI 33.64 kg/m   BP Readings from Last 3 Encounters:  12/26/21 128/78  12/26/20 128/78  06/20/20 138/80    Wt Readings from Last 3 Encounters:  12/26/21 185 lb 6.4 oz (84.1 kg)  12/26/20 184 lb 3.2 oz (83.6  kg)  06/20/20 184 lb 9.6 oz (83.7 kg)    Physical Exam Constitutional:      General: She is not in acute distress.    Appearance: Normal appearance. She is well-developed.  HENT:     Head: Normocephalic.     Right Ear: External ear normal.     Left Ear: External ear normal.     Nose: Nose normal.  Eyes:     General:        Right eye: No discharge.        Left eye: No discharge.     Conjunctiva/sclera: Conjunctivae normal.     Pupils: Pupils are equal, round, and reactive to light.  Neck:     Thyroid: No thyromegaly.     Vascular: No JVD.     Trachea: No tracheal deviation.  Cardiovascular:     Rate and Rhythm: Normal rate and regular rhythm.     Heart sounds: Normal heart sounds.  Pulmonary:     Effort: No respiratory distress.     Breath sounds: No stridor. No wheezing.  Abdominal:     General: Bowel sounds are normal. There is no distension.     Palpations: Abdomen is soft. There is no mass.     Tenderness: There is no abdominal tenderness. There is no guarding or rebound.  Musculoskeletal:        General: No tenderness.  Cervical back: Normal range of motion and neck supple. No rigidity.  Lymphadenopathy:     Cervical: No cervical adenopathy.  Skin:    Findings: No erythema or rash.  Neurological:     Cranial Nerves: No cranial nerve deficit.     Motor: No abnormal muscle tone.     Coordination: Coordination normal.     Deep Tendon Reflexes: Reflexes normal.  Psychiatric:        Behavior: Behavior normal.        Thought Content: Thought content normal.        Judgment: Judgment normal.     Lab Results  Component Value Date   WBC 4.7 12/26/2020   HGB 13.1 12/26/2020   HCT 39.4 12/26/2020   PLT 355.0 12/26/2020   GLUCOSE 87 12/26/2020   CHOL 205 (H) 12/26/2020   TRIG 76.0 12/26/2020   HDL 65.60 12/26/2020   LDLDIRECT 133.5 04/28/2013   LDLCALC 124 (H) 12/26/2020   ALT 10 12/26/2020   AST 20 12/26/2020   NA 140 12/26/2020   K 3.7 12/26/2020   CL  105 12/26/2020   CREATININE 0.67 12/26/2020   BUN 13 12/26/2020   CO2 29 12/26/2020   TSH 0.62 12/26/2020   PSA 0.00 (L) 09/24/2017    CT CARDIAC SCORING ([facility] LOCATIONS ONLY)  Result Date: 10/16/2020 CLINICAL DATA:  Dyslipidemia.  Essential hypertension. EXAM: CT CARDIAC CORONARY ARTERY CALCIUM SCORE TECHNIQUE: Non-contrast imaging through the heart was performed using prospective ECG gating. Image post processing was performed on an independent workstation, allowing for quantitative analysis of the heart and coronary arteries. Note that this exam targets the heart and the chest was not imaged in its entirety. COMPARISON:  01/15/2005 FINDINGS: CORONARY CALCIUM SCORES: Left Main: 0 LAD: 0 LCx: 0 RCA: 0 Total Agatston Score: 0 MESA database percentile: 0 AORTA MEASUREMENTS: Ascending Aorta: 34 mm Descending Aorta: 27 mm OTHER FINDINGS: Heart is normal size. Aorta normal caliber. No adenopathy. Small subpleural nodule in the left lower lobe on image 48 measures 3 mm, new since prior study. No confluent opacities or effusions. Imaging into the upper abdomen demonstrates no acute findings. Chest wall soft tissues are unremarkable. No acute bony abnormality. IMPRESSION: No visible coronary artery calcifications. Total coronary calcium score of 0. 3 mm subpleural left lower lobe pulmonary nodule. No follow-up needed if patient is low-risk. Non-contrast chest CT can be considered in 12 months if patient is high-risk. This recommendation follows the consensus statement: Guidelines for Management of Incidental Pulmonary Nodules Detected on CT Images: From the Fleischner Society 2017; Radiology 2017; 284:228-243. Electronically Signed   By: Rolm Baptise M.D.   On: 10/16/2020 12:53    Assessment & Plan:   Problem List Items Addressed This Visit     B12 deficiency    Check B12      Relevant Orders   Vitamin B12   VITAMIN D 25 Hydroxy (Vit-D Deficiency, Fractures)   Essential hypertension    BP nl at  home      Relevant Medications   losartan (COZAAR) 100 MG tablet   hydrochlorothiazide (MICROZIDE) 12.5 MG capsule   Hyperthyroidism    Check TSH, FT4      Relevant Medications   levothyroxine (SYNTHROID) 100 MCG tablet   Other Relevant Orders   T4, free   Vitamin D deficiency    Check Vit D level      Well adult exam - Primary    We discussed age appropriate health related issues, including available/recomended  screening tests and vaccinations. We discussed a need for adhering to healthy diet and exercise. Labs/EKG were reviewed/ordered. All questions were answered.  2022 CT IMPRESSION: No visible coronary artery calcifications. Total coronary calcium score of 0.  Cologuard ordered       Relevant Orders   TSH   Urinalysis   CBC with Differential/Platelet   Lipid panel   Comprehensive metabolic panel   Cologuard   T4, free   Vitamin B12   VITAMIN D 25 Hydroxy (Vit-D Deficiency, Fractures)   Other Visit Diagnoses     Colon cancer screening       Relevant Orders   Cologuard         Meds ordered this encounter  Medications   losartan (COZAAR) 100 MG tablet    Sig: Take 1 tablet (100 mg total) by mouth daily.    Dispense:  90 tablet    Refill:  3   levothyroxine (SYNTHROID) 100 MCG tablet    Sig: Take 1 tablet (100 mcg total) by mouth daily.    Dispense:  90 tablet    Refill:  3   hydrochlorothiazide (MICROZIDE) 12.5 MG capsule    Sig: Take 1 capsule (12.5 mg total) by mouth daily.    Dispense:  90 capsule    Refill:  3      Follow-up: No follow-ups on file.  Walker Kehr, MD

## 2022-01-26 DIAGNOSIS — Z1211 Encounter for screening for malignant neoplasm of colon: Secondary | ICD-10-CM | POA: Diagnosis not present

## 2022-02-05 LAB — COLOGUARD: COLOGUARD: NEGATIVE

## 2022-06-21 ENCOUNTER — Other Ambulatory Visit: Payer: Self-pay

## 2022-07-05 DIAGNOSIS — Z1231 Encounter for screening mammogram for malignant neoplasm of breast: Secondary | ICD-10-CM | POA: Diagnosis not present

## 2022-07-05 LAB — HM MAMMOGRAPHY

## 2022-07-10 ENCOUNTER — Encounter: Payer: Self-pay | Admitting: Internal Medicine

## 2022-08-06 ENCOUNTER — Ambulatory Visit (HOSPITAL_BASED_OUTPATIENT_CLINIC_OR_DEPARTMENT_OTHER): Payer: Commercial Managed Care - PPO | Admitting: Advanced Practice Midwife

## 2022-08-06 ENCOUNTER — Encounter (HOSPITAL_BASED_OUTPATIENT_CLINIC_OR_DEPARTMENT_OTHER): Payer: Self-pay | Admitting: Advanced Practice Midwife

## 2022-08-06 VITALS — BP 150/83 | HR 74 | Ht 62.0 in | Wt 191.4 lb

## 2022-08-06 DIAGNOSIS — Z01419 Encounter for gynecological examination (general) (routine) without abnormal findings: Secondary | ICD-10-CM | POA: Diagnosis not present

## 2022-08-06 NOTE — Progress Notes (Signed)
   Subjective:     Krystal Gibbs is a 62 y.o. female here at [facility] for a routine exam.  Current complaints: none.  Personal health questionnaire reviewed: yes.  Do you have a primary care provider? yes Do you feel safe at home? yes  Flowsheet Row Office Visit from 08/06/2022 in [facility] for [facility] at [facility] Total Score 0       Health Maintenance Due  Topic Date Due   HIV Screening  Never done   Hepatitis C Screening  Never done   Zoster Vaccines- Shingrix (1 of 2) Never done   DTaP/Tdap/Td (2 - Tdap) 09/22/2020   COVID-19 Vaccine (4 - 2023-24 season) 11/23/2021   PAP SMEAR-Modifier  03/14/2022     Risk factors for chronic health problems: Smoking: never Alchohol/how much:  none Pt BMI: Body mass index is 35.01 kg/m.   Gynecologic History No LMP recorded. Patient has had a hysterectomy. Contraception: status post hysterectomy Last Pap: 2020. Results were: normal Last mammogram: 07/05/22. Results were: normal  Obstetric History OB History  Gravida Para Term Preterm AB Living  1 1 1     1   SAB IAB Ectopic Multiple Live Births               # Outcome Date GA Lbr Len/2nd Weight Sex Delivery Anes PTL Lv  1 Term              The following portions of the patient's history were reviewed and updated as appropriate: allergies, current medications, past family history, past medical history, past social history, past surgical history, and problem list.  Review of Systems Pertinent items noted in HPI and remainder of comprehensive ROS otherwise negative.    Objective:  BP (!) 150/83   Pulse 74   Ht 5\' 2"  (1.575 m)   Wt 191 lb 6.4 oz (86.8 kg)   BMI 35.01 kg/m   VS reviewed, nursing note reviewed,  Constitutional: well developed, well nourished, no distress HEENT: normocephalic, thyroid without enlargement or mass HEART: RRR, no murmurs rubs/gallops RESP: clear and equal to auscultation bilaterally in all lobes   Breast Exam:  exam performed: right breast normal without mass, skin or nipple changes or axillary nodes, left breast normal without mass, skin or nipple changes or axillary nodes Abdomen: soft Neuro: alert and oriented x 3 Skin: warm, dry Psych: affect normal Pelvic exam: SSE deferred Bimanual exam: Cervix 0/long/high, firm, anterior, neg CMT, uterus nontender, nonenlarged, adnexa without tenderness, enlargement, or mass        Assessment/Plan:   1. Encounter for annual routine gynecological examination --No gyn concerns, had some vaginal atrophy, topical estrogen in 2022 with mild improvement. Pt not currently using and does not desire a refill. Is managing well without. --Cologuard completed this year --Mammogram at [facility] 07/05/22 --No Pap recommended with hx hysterectomy --F/U in 1 year     Return in about 1 year (around 08/06/2023) for annual exam.   Sharen Counter, CNM 9:19 AM

## 2022-11-01 ENCOUNTER — Ambulatory Visit: Payer: Commercial Managed Care - PPO | Admitting: Podiatry

## 2022-11-01 DIAGNOSIS — M7662 Achilles tendinitis, left leg: Secondary | ICD-10-CM

## 2022-11-01 NOTE — Progress Notes (Signed)
Subjective:  Patient ID: Krystal Gibbs, female    DOB: Sep 07, 1960,  MRN: [id number]  Chief Complaint  Patient presents with   Foot Pain    Left foot pain at the achilles she stated that she feels like it is pulling only feels it when she walks     62 y.o. female presents with the above complaint.  Patient presents with left posterior Achilles heel pain insertional pain.  Patient states has been going on for quite some time hurts with pulling.  She states she wears dress shoes mostly at work and walks a lot on her foot.  Denies seeing anyone else prior to seeing me pain spoke a scale 7 out of 10 dull achy in nature.   Review of Systems: Negative except as noted in the HPI. Denies N/V/F/Ch.  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   Pain in limb    Rash and other nonspecific skin eruption    Unspecified pruritic disorder    Unspecified vitamin D deficiency     Current Outpatient Medications:    b complex vitamins tablet, Take 1 tablet by mouth daily., Disp: 100 tablet, Rfl: 3   Cholecalciferol (VITAMIN D PO), Take 1,000 Int'l Units by mouth daily., Disp: , Rfl:    hydrochlorothiazide (MICROZIDE) 12.5 MG capsule, Take 1 capsule (12.5 mg total) by mouth daily., Disp: 90 capsule, Rfl: 3   levothyroxine (SYNTHROID) 100 MCG tablet, Take 1 tablet (100 mcg total) by mouth daily., Disp: 90 tablet, Rfl: 3   losartan (COZAAR) 100 MG tablet, Take 1 tablet (100 mg total) by mouth daily., Disp: 90 tablet, Rfl: 3   triamcinolone cream (KENALOG) 0.1 %, Apply 1 application topically 4 (four) times daily. (Patient not taking: Reported on 08/06/2022), Disp: 160 g,  Rfl: 1  Social History   Tobacco Use  Smoking Status Never  Smokeless Tobacco Never    Allergies  Allergen Reactions   Latex    Penicillins    Sulfonamide Derivatives Rash   Objective:  There were no vitals filed for this visit. There is no height or weight on file to calculate BMI. Constitutional Well developed. Well nourished.  Vascular Dorsalis pedis pulses palpable bilaterally. Posterior tibial pulses palpable bilaterally. Capillary refill normal to all digits.  No cyanosis or clubbing noted. Pedal hair growth normal.  Neurologic Normal speech. Oriented to person, place, and time. Epicritic sensation to light touch grossly present bilaterally.  Dermatologic Nails well groomed and normal in appearance. No open wounds. No skin lesions.  Orthopedic: Pain on palpation of Achilles insertion pain with dorsiflexion of the ankle joint no pain with plantarflexion of the ankle joint no deep intra-articular ankle exam pain noted no pain at the posterior tibial peroneal tendon of and ATFL ligament noted.  Haglund's deformity clinically appreciated positive Silfverskiold test with gastrocnemius equinus.   Radiographs: None Assessment:   1. Left Achilles tendinitis    Plan:  Patient was evaluated and treated and all questions answered.  Left Achilles tendinitis -All questions and concerns were discussed with the patient extensive detail  given the amount of pain that she is having I believe patient will benefit from cam boot immobilization -Cam boot was dispensed.  No follow-ups on file.

## 2022-11-29 ENCOUNTER — Ambulatory Visit: Payer: Commercial Managed Care - PPO | Admitting: Podiatry

## 2022-12-15 ENCOUNTER — Other Ambulatory Visit: Payer: Self-pay | Admitting: Internal Medicine

## 2022-12-16 ENCOUNTER — Other Ambulatory Visit (HOSPITAL_COMMUNITY): Payer: Self-pay

## 2022-12-16 MED ORDER — LEVOTHYROXINE SODIUM 100 MCG PO TABS
100.0000 ug | ORAL_TABLET | Freq: Every day | ORAL | 3 refills | Status: DC
  Filled 2022-12-16: qty 90, 90d supply, fill #0
  Filled 2023-03-17: qty 90, 90d supply, fill #1
  Filled 2023-06-14: qty 90, 90d supply, fill #2
  Filled 2023-09-12: qty 90, 90d supply, fill #3

## 2022-12-16 MED ORDER — LOSARTAN POTASSIUM 100 MG PO TABS
100.0000 mg | ORAL_TABLET | Freq: Every day | ORAL | 3 refills | Status: DC
  Filled 2022-12-16: qty 90, 90d supply, fill #0
  Filled 2023-03-17: qty 90, 90d supply, fill #1
  Filled 2023-06-14: qty 90, 90d supply, fill #2
  Filled 2023-09-12: qty 90, 90d supply, fill #3

## 2022-12-16 MED ORDER — HYDROCHLOROTHIAZIDE 12.5 MG PO CAPS
12.5000 mg | ORAL_CAPSULE | Freq: Every day | ORAL | 3 refills | Status: DC
  Filled 2022-12-16: qty 90, 90d supply, fill #0
  Filled 2023-03-17: qty 90, 90d supply, fill #1
  Filled 2023-06-14: qty 90, 90d supply, fill #2
  Filled 2023-09-12: qty 90, 90d supply, fill #3

## 2022-12-30 ENCOUNTER — Encounter: Payer: 59 | Admitting: Internal Medicine

## 2023-02-04 ENCOUNTER — Encounter: Payer: Commercial Managed Care - PPO | Admitting: Internal Medicine

## 2023-02-10 ENCOUNTER — Encounter: Payer: Commercial Managed Care - PPO | Admitting: Internal Medicine

## 2023-02-18 ENCOUNTER — Encounter: Payer: Commercial Managed Care - PPO | Admitting: Internal Medicine

## 2023-02-18 DIAGNOSIS — H5203 Hypermetropia, bilateral: Secondary | ICD-10-CM | POA: Diagnosis not present

## 2023-02-19 ENCOUNTER — Other Ambulatory Visit (HOSPITAL_COMMUNITY): Payer: Self-pay

## 2023-02-19 ENCOUNTER — Encounter: Payer: Self-pay | Admitting: Internal Medicine

## 2023-02-19 ENCOUNTER — Ambulatory Visit: Payer: Commercial Managed Care - PPO | Admitting: Internal Medicine

## 2023-02-19 VITALS — BP 118/76 | HR 87 | Temp 98.6°F | Ht 62.0 in | Wt 188.0 lb

## 2023-02-19 DIAGNOSIS — Z780 Asymptomatic menopausal state: Secondary | ICD-10-CM

## 2023-02-19 DIAGNOSIS — M7662 Achilles tendinitis, left leg: Secondary | ICD-10-CM

## 2023-02-19 DIAGNOSIS — M7660 Achilles tendinitis, unspecified leg: Secondary | ICD-10-CM | POA: Insufficient documentation

## 2023-02-19 DIAGNOSIS — Z13220 Encounter for screening for lipoid disorders: Secondary | ICD-10-CM

## 2023-02-19 DIAGNOSIS — Z0000 Encounter for general adult medical examination without abnormal findings: Secondary | ICD-10-CM

## 2023-02-19 DIAGNOSIS — E538 Deficiency of other specified B group vitamins: Secondary | ICD-10-CM | POA: Diagnosis not present

## 2023-02-19 LAB — URINALYSIS
Bilirubin Urine: NEGATIVE
Hgb urine dipstick: NEGATIVE
Ketones, ur: NEGATIVE
Leukocytes,Ua: NEGATIVE
Nitrite: NEGATIVE
Specific Gravity, Urine: 1.015 (ref 1.000–1.030)
Total Protein, Urine: NEGATIVE
Urine Glucose: NEGATIVE
Urobilinogen, UA: 1 (ref 0.0–1.0)
pH: 7 (ref 5.0–8.0)

## 2023-02-19 LAB — CBC WITH DIFFERENTIAL/PLATELET
Basophils Absolute: 0.1 10*3/uL (ref 0.0–0.1)
Basophils Relative: 1.1 % (ref 0.0–3.0)
Eosinophils Absolute: 0.1 10*3/uL (ref 0.0–0.7)
Eosinophils Relative: 2.1 % (ref 0.0–5.0)
HCT: 40.7 % (ref 36.0–46.0)
Hemoglobin: 13.9 g/dL (ref 12.0–15.0)
Lymphocytes Relative: 36.8 % (ref 12.0–46.0)
Lymphs Abs: 1.7 10*3/uL (ref 0.7–4.0)
MCHC: 34 g/dL (ref 30.0–36.0)
MCV: 87.7 fL (ref 78.0–100.0)
Monocytes Absolute: 0.5 10*3/uL (ref 0.1–1.0)
Monocytes Relative: 9.8 % (ref 3.0–12.0)
Neutro Abs: 2.4 10*3/uL (ref 1.4–7.7)
Neutrophils Relative %: 50.2 % (ref 43.0–77.0)
Platelets: 376 10*3/uL (ref 150.0–400.0)
RBC: 4.65 Mil/uL (ref 3.87–5.11)
RDW: 13.5 % (ref 11.5–15.5)
WBC: 4.7 10*3/uL (ref 4.0–10.5)

## 2023-02-19 LAB — COMPREHENSIVE METABOLIC PANEL
ALT: 12 U/L (ref 0–35)
AST: 22 U/L (ref 0–37)
Albumin: 4.3 g/dL (ref 3.5–5.2)
Alkaline Phosphatase: 77 U/L (ref 39–117)
BUN: 17 mg/dL (ref 6–23)
CO2: 28 meq/L (ref 19–32)
Calcium: 9.5 mg/dL (ref 8.4–10.5)
Chloride: 105 meq/L (ref 96–112)
Creatinine, Ser: 0.75 mg/dL (ref 0.40–1.20)
GFR: 84.11 mL/min (ref >60.00)
Glucose, Bld: 92 mg/dL (ref 70–99)
Potassium: 3.6 meq/L (ref 3.5–5.1)
Sodium: 140 meq/L (ref 135–145)
Total Bilirubin: 0.8 mg/dL (ref 0.2–1.2)
Total Protein: 7.1 g/dL (ref 6.0–8.3)

## 2023-02-19 LAB — VITAMIN D 25 HYDROXY (VIT D DEFICIENCY, FRACTURES): VITD: 29.85 ng/mL — ABNORMAL LOW (ref 30.00–100.00)

## 2023-02-19 LAB — LIPID PANEL
Cholesterol: 214 mg/dL — ABNORMAL HIGH (ref 0–200)
HDL: 61.2 mg/dL (ref >39.00)
LDL Cholesterol: 140 mg/dL — ABNORMAL HIGH (ref 0–99)
NonHDL: 153.01
Total CHOL/HDL Ratio: 4
Triglycerides: 67 mg/dL (ref 0.0–149.0)
VLDL: 13.4 mg/dL (ref 0.0–40.0)

## 2023-02-19 LAB — TSH: TSH: 0.84 u[IU]/mL (ref 0.35–5.50)

## 2023-02-19 LAB — VITAMIN B12: Vitamin B-12: 441 pg/mL (ref 211–911)

## 2023-02-19 MED ORDER — MELOXICAM 15 MG PO TABS
15.0000 mg | ORAL_TABLET | Freq: Every day | ORAL | 2 refills | Status: AC | PRN
  Filled 2023-02-19: qty 30, 30d supply, fill #0

## 2023-02-19 NOTE — Progress Notes (Signed)
Subjective:  Patient ID: Krystal Gibbs, female    DOB: 1960-06-07  Age: 62 y.o. MRN: [id number]  CC: Annual Exam   HPI Shivaun Binney presents for a well exam  Outpatient Medications Prior to Visit  Medication Sig Dispense Refill   b complex vitamins tablet Take 1 tablet by mouth daily. 100 tablet 3   Cholecalciferol (VITAMIN D PO) Take 1,000 Int'l Units by mouth daily.     hydrochlorothiazide (MICROZIDE) 12.5 MG capsule Take 1 capsule (12.5 mg total) by mouth daily. 90 capsule 3   levothyroxine (SYNTHROID) 100 MCG tablet Take 1 tablet (100 mcg total) by mouth daily. 90 tablet 3   losartan (COZAAR) 100 MG tablet Take 1 tablet (100 mg total) by mouth daily. 90 tablet 3   triamcinolone cream (KENALOG) 0.1 % Apply 1 application topically 4 (four) times daily. (Patient not taking: Reported on 08/06/2022) 160 g 1   No facility-administered medications prior to visit.    ROS: Review of Systems  Constitutional:  Negative for activity change, appetite change, chills, fatigue and unexpected weight change.  HENT:  Negative for congestion, mouth sores and sinus pressure.   Eyes:  Negative for visual disturbance.  Respiratory:  Negative for cough and chest tightness.   Gastrointestinal:  Negative for abdominal pain and nausea.  Genitourinary:  Negative for difficulty urinating, frequency and vaginal pain.  Musculoskeletal:  Positive for gait problem. Negative for back pain.  Skin:  Negative for pallor and rash.  Neurological:  Negative for dizziness, tremors, weakness, numbness and headaches.  Psychiatric/Behavioral:  Negative for confusion and sleep disturbance.     Objective:  BP 118/76 (BP Location: Right Arm, Patient Position: Sitting, Cuff Size: Normal)   Pulse 87   Temp 98.6 F (37 C) (Oral)   Ht 5\' 2"  (1.575 m)   Wt 188 lb (85.3 kg)   SpO2 97%   BMI 34.39 kg/m   BP Readings from Last 3 Encounters:  02/19/23 118/76  08/06/22 (!) 150/83  12/26/21 128/78    Wt  Readings from Last 3 Encounters:  02/19/23 188 lb (85.3 kg)  08/06/22 191 lb 6.4 oz (86.8 kg)  12/26/21 185 lb 6.4 oz (84.1 kg)    Physical Exam Constitutional:      General: She is not in acute distress.    Appearance: She is well-developed.  HENT:     Head: Normocephalic.     Right Ear: External ear normal.     Left Ear: External ear normal.     Nose: Nose normal.  Eyes:     General:        Right eye: No discharge.        Left eye: No discharge.     Conjunctiva/sclera: Conjunctivae normal.     Pupils: Pupils are equal, round, and reactive to light.  Neck:     Thyroid: No thyromegaly.     Vascular: No JVD.     Trachea: No tracheal deviation.  Cardiovascular:     Rate and Rhythm: Normal rate and regular rhythm.     Heart sounds: Normal heart sounds.  Pulmonary:     Effort: No respiratory distress.     Breath sounds: No stridor. No wheezing.  Abdominal:     General: Bowel sounds are normal. There is no distension.     Palpations: Abdomen is soft. There is no mass.     Tenderness: There is no abdominal tenderness. There is no guarding or rebound.  Musculoskeletal:  General: Tenderness present.     Cervical back: Normal range of motion and neck supple. No rigidity.  Lymphadenopathy:     Cervical: No cervical adenopathy.  Skin:    Findings: No erythema or rash.  Neurological:     Mental Status: She is oriented to person, place, and time.     Cranial Nerves: No cranial nerve deficit.     Motor: No abnormal muscle tone.     Coordination: Coordination normal.     Deep Tendon Reflexes: Reflexes normal.  Psychiatric:        Behavior: Behavior normal.        Thought Content: Thought content normal.        Judgment: Judgment normal.    L achilles tendon w/pain  Lab Results  Component Value Date   WBC 5.3 12/26/2021   HGB 12.8 12/26/2021   HCT 37.3 12/26/2021   PLT 326.0 12/26/2021   GLUCOSE 94 12/26/2021   CHOL 188 12/26/2021   TRIG 68.0 12/26/2021   HDL  57.30 12/26/2021   LDLDIRECT 133.5 04/28/2013   LDLCALC 118 (H) 12/26/2021   ALT 11 12/26/2021   AST 17 12/26/2021   NA 140 12/26/2021   K 3.9 12/26/2021   CL 104 12/26/2021   CREATININE 0.75 12/26/2021   BUN 11 12/26/2021   CO2 28 12/26/2021   TSH 0.74 12/26/2021   PSA 0.00 (L) 09/24/2017    CT CARDIAC SCORING ([facility] LOCATIONS ONLY)  Result Date: 10/16/2020 CLINICAL DATA:  Dyslipidemia.  Essential hypertension. EXAM: CT CARDIAC CORONARY ARTERY CALCIUM SCORE TECHNIQUE: Non-contrast imaging through the heart was performed using prospective ECG gating. Image post processing was performed on an independent workstation, allowing for quantitative analysis of the heart and coronary arteries. Note that this exam targets the heart and the chest was not imaged in its entirety. COMPARISON:  01/15/2005 FINDINGS: CORONARY CALCIUM SCORES: Left Main: 0 LAD: 0 LCx: 0 RCA: 0 Total Agatston Score: 0 MESA database percentile: 0 AORTA MEASUREMENTS: Ascending Aorta: 34 mm Descending Aorta: 27 mm OTHER FINDINGS: Heart is normal size. Aorta normal caliber. No adenopathy. Small subpleural nodule in the left lower lobe on image 48 measures 3 mm, new since prior study. No confluent opacities or effusions. Imaging into the upper abdomen demonstrates no acute findings. Chest wall soft tissues are unremarkable. No acute bony abnormality. IMPRESSION: No visible coronary artery calcifications. Total coronary calcium score of 0. 3 mm subpleural left lower lobe pulmonary nodule. No follow-up needed if patient is low-risk. Non-contrast chest CT can be considered in 12 months if patient is high-risk. This recommendation follows the consensus statement: Guidelines for Management of Incidental Pulmonary Nodules Detected on CT Images: From the Fleischner Society 2017; Radiology 2017; 284:228-243. Electronically Signed   By: Charlett Nose M.D.   On: 10/16/2020 12:53    Assessment & Plan:   Problem List Items Addressed This Visit      B12 deficiency    Check B12      Relevant Orders   Vitamin B12   Well adult exam - Primary     We discussed age appropriate health related issues, including available/recomended screening tests and vaccinations. Labs were ordered to be later reviewed . All questions were answered. We discussed one or more of the following - seat belt use, use of sunscreen/sun exposure exercise, fall risk reduction, second hand smoke exposure, firearm use and storage, seat belt use, a need for adhering to healthy diet and exercise. Labs were ordered.  All questions were  answered. 2022 CT IMPRESSION: No visible coronary artery calcifications. Total coronary calcium score of 0. Cologuard ordered       Relevant Orders   TSH   Urinalysis   CBC with Differential/Platelet   Lipid panel   Comprehensive metabolic panel   Achilles tendinitis    F/u w/Dr Allena Katz 1/4 inch heel lift Blue-Emu cream  use 2-3 times a day [place]?      Relevant Orders   VITAMIN D 25 Hydroxy (Vit-D Deficiency, Fractures)   Other Visit Diagnoses     Postmenopausal estrogen deficiency       Relevant Orders   DG Bone Density   VITAMIN D 25 Hydroxy (Vit-D Deficiency, Fractures)         Meds ordered this encounter  Medications   meloxicam (MOBIC) 15 MG tablet    Sig: Take 1 tablet (15 mg total) by mouth daily as needed for pain.    Dispense:  30 tablet    Refill:  2      Follow-up: Return in about 1 year (around 02/19/2024) for Wellness Exam.  Sonda Primes, MD

## 2023-02-19 NOTE — Assessment & Plan Note (Signed)
  We discussed age appropriate health related issues, including available/recomended screening tests and vaccinations. Labs were ordered to be later reviewed . All questions were answered. We discussed one or more of the following - seat belt use, use of sunscreen/sun exposure exercise, fall risk reduction, second hand smoke exposure, firearm use and storage, seat belt use, a need for adhering to healthy diet and exercise. Labs were ordered.  All questions were answered. 2022 CT IMPRESSION: No visible coronary artery calcifications. Total coronary calcium score of 0. Cologuard ordered

## 2023-02-19 NOTE — Assessment & Plan Note (Signed)
Check B12

## 2023-02-19 NOTE — Patient Instructions (Signed)
1/4 inch heel lift Blue-Emu cream  use 2-3 times a day [place]?

## 2023-02-19 NOTE — Assessment & Plan Note (Signed)
F/u w/Dr Allena Katz 1/4 inch heel lift Blue-Emu cream  use 2-3 times a day [place]?

## 2023-02-24 ENCOUNTER — Encounter: Payer: Self-pay | Admitting: Internal Medicine

## 2023-02-27 ENCOUNTER — Other Ambulatory Visit: Payer: Self-pay | Admitting: Internal Medicine

## 2023-02-27 DIAGNOSIS — R911 Solitary pulmonary nodule: Secondary | ICD-10-CM

## 2023-03-17 ENCOUNTER — Ambulatory Visit (INDEPENDENT_AMBULATORY_CARE_PROVIDER_SITE_OTHER)
Admission: RE | Admit: 2023-03-17 | Discharge: 2023-03-17 | Disposition: A | Discharge status: Home / Self Care | Payer: Commercial Managed Care - PPO | Source: Ambulatory Visit | Attending: Internal Medicine | Admitting: Internal Medicine

## 2023-03-17 ENCOUNTER — Ambulatory Visit
Admission: RE | Admit: 2023-03-17 | Discharge: 2023-03-17 | Disposition: A | Discharge status: Home / Self Care | Payer: Commercial Managed Care - PPO | Source: Ambulatory Visit | Attending: Internal Medicine | Admitting: Internal Medicine

## 2023-03-17 DIAGNOSIS — R911 Solitary pulmonary nodule: Secondary | ICD-10-CM

## 2023-03-17 DIAGNOSIS — Z780 Asymptomatic menopausal state: Secondary | ICD-10-CM

## 2023-06-16 ENCOUNTER — Encounter: Payer: Self-pay | Admitting: Internal Medicine

## 2023-08-15 DIAGNOSIS — Z1231 Encounter for screening mammogram for malignant neoplasm of breast: Secondary | ICD-10-CM | POA: Diagnosis not present

## 2023-08-15 LAB — HM MAMMOGRAPHY

## 2023-08-19 ENCOUNTER — Encounter: Payer: Self-pay | Admitting: Internal Medicine

## 2023-09-08 ENCOUNTER — Ambulatory Visit: Admitting: Internal Medicine

## 2023-09-10 ENCOUNTER — Ambulatory Visit: Admitting: Internal Medicine

## 2023-09-19 ENCOUNTER — Other Ambulatory Visit (HOSPITAL_COMMUNITY): Payer: Self-pay | Admitting: Nurse Practitioner

## 2023-09-19 ENCOUNTER — Ambulatory Visit (HOSPITAL_COMMUNITY)
Admission: RE | Admit: 2023-09-19 | Discharge: 2023-09-19 | Disposition: A | Discharge status: Home / Self Care | Payer: Self-pay | Source: Ambulatory Visit | Attending: Nurse Practitioner | Admitting: Nurse Practitioner

## 2023-09-19 DIAGNOSIS — R52 Pain, unspecified: Secondary | ICD-10-CM

## 2023-09-19 DIAGNOSIS — M5136 Other intervertebral disc degeneration, lumbar region with discogenic back pain only: Secondary | ICD-10-CM | POA: Diagnosis not present

## 2023-09-19 DIAGNOSIS — M160 Bilateral primary osteoarthritis of hip: Secondary | ICD-10-CM | POA: Diagnosis not present

## 2023-09-19 DIAGNOSIS — M47816 Spondylosis without myelopathy or radiculopathy, lumbar region: Secondary | ICD-10-CM | POA: Diagnosis not present

## 2023-10-17 ENCOUNTER — Other Ambulatory Visit (HOSPITAL_COMMUNITY): Payer: Self-pay

## 2023-10-17 MED ORDER — METHOCARBAMOL 500 MG PO TABS
500.0000 mg | ORAL_TABLET | Freq: Four times a day (QID) | ORAL | 1 refills | Status: AC | PRN
  Filled 2023-10-17: qty 60, 15d supply, fill #0

## 2023-10-17 MED ORDER — CYCLOBENZAPRINE HCL 5 MG PO TABS
5.0000 mg | ORAL_TABLET | Freq: Every evening | ORAL | 0 refills | Status: AC | PRN
  Filled 2023-10-17: qty 60, 30d supply, fill #0

## 2023-10-21 ENCOUNTER — Other Ambulatory Visit (HOSPITAL_COMMUNITY): Payer: Self-pay

## 2023-10-27 ENCOUNTER — Other Ambulatory Visit (HOSPITAL_COMMUNITY): Payer: Self-pay

## 2023-12-03 ENCOUNTER — Encounter: Payer: Self-pay | Admitting: Internal Medicine

## 2023-12-03 ENCOUNTER — Ambulatory Visit (INDEPENDENT_AMBULATORY_CARE_PROVIDER_SITE_OTHER): Payer: Self-pay | Admitting: Internal Medicine

## 2023-12-03 VITALS — BP 130/92 | HR 77 | Temp 97.6°F | Ht 62.0 in | Wt 199.2 lb

## 2023-12-03 DIAGNOSIS — E034 Atrophy of thyroid (acquired): Secondary | ICD-10-CM

## 2023-12-03 DIAGNOSIS — I10 Essential (primary) hypertension: Secondary | ICD-10-CM

## 2023-12-03 DIAGNOSIS — E538 Deficiency of other specified B group vitamins: Secondary | ICD-10-CM

## 2023-12-03 DIAGNOSIS — M5440 Lumbago with sciatica, unspecified side: Secondary | ICD-10-CM

## 2023-12-03 LAB — COMPREHENSIVE METABOLIC PANEL WITH GFR
ALT: 13 U/L (ref 0–35)
AST: 15 U/L (ref 0–37)
Albumin: 4.2 g/dL (ref 3.5–5.2)
Alkaline Phosphatase: 77 U/L (ref 39–117)
BUN: 19 mg/dL (ref 6–23)
CO2: 29 meq/L (ref 19–32)
Calcium: 9.4 mg/dL (ref 8.4–10.5)
Chloride: 104 meq/L (ref 96–112)
Creatinine, Ser: 0.73 mg/dL (ref 0.40–1.20)
GFR: 86.41 mL/min (ref >60.00)
Glucose, Bld: 90 mg/dL (ref 70–99)
Potassium: 4 meq/L (ref 3.5–5.1)
Sodium: 140 meq/L (ref 135–145)
Total Bilirubin: 0.6 mg/dL (ref 0.2–1.2)
Total Protein: 7.2 g/dL (ref 6.0–8.3)

## 2023-12-03 LAB — T4, FREE: Free T4: 1.16 ng/dL (ref 0.60–1.60)

## 2023-12-03 LAB — TSH: TSH: 0.77 u[IU]/mL (ref 0.35–5.50)

## 2023-12-03 NOTE — Progress Notes (Signed)
 Subjective:  Patient ID: Krystal Gibbs, female    DOB: 05-16-1960  Age: 63 y.o. MRN: [id number]  CC: Medical Management of Chronic Issues (Patient states she is here to check her thyroid . States aches and pains. Patient fell at work and hurt her back. Asking for handicap placard. Patient fell June 12th. )   HPI Krystal Gibbs presents for LBP s/p fall at work on 09/04/23- seeing Dr Beuford F/u on thyroid  check, HTN  Outpatient Medications Prior to Visit  Medication Sig Dispense Refill   b complex vitamins tablet Take 1 tablet by mouth daily. 100 tablet 3   Cholecalciferol  (VITAMIN D  PO) Take 1,000 Int'l Units by mouth daily.     cyclobenzaprine  (FLEXERIL ) 5 MG tablet Take 1-2 tablets (5-10 mg total) by mouth at bedtime as needed. 60 tablet 0   hydrochlorothiazide  (MICROZIDE ) 12.5 MG capsule Take 1 capsule (12.5 mg total) by mouth daily. 90 capsule 3   levothyroxine  (SYNTHROID ) 100 MCG tablet Take 1 tablet (100 mcg total) by mouth daily. 90 tablet 3   losartan  (COZAAR ) 100 MG tablet Take 1 tablet (100 mg total) by mouth daily. 90 tablet 3   meloxicam  (MOBIC ) 15 MG tablet Take 1 tablet (15 mg total) by mouth daily as needed for pain. (Patient not taking: Reported on 12/03/2023) 30 tablet 2   methocarbamol  (ROBAXIN ) 500 MG tablet Take 1 tablet (500 mg total) by mouth every 6 (six) hours as needed, for MUSCLE SPASMS AND PAIN. (Patient not taking: Reported on 12/03/2023) 60 tablet 1   triamcinolone  cream (KENALOG ) 0.1 % Apply 1 application topically 4 (four) times daily. (Patient not taking: Reported on 12/03/2023) 160 g 1   No facility-administered medications prior to visit.    ROS: Review of Systems  Constitutional:  Negative for activity change, appetite change, chills, fatigue and unexpected weight change.  HENT:  Negative for congestion, mouth sores and sinus pressure.   Eyes:  Negative for visual disturbance.  Respiratory:  Negative for cough and chest tightness.    Gastrointestinal:  Negative for abdominal pain and nausea.  Genitourinary:  Negative for difficulty urinating, frequency and vaginal pain.  Musculoskeletal:  Positive for back pain. Negative for gait problem.  Skin:  Negative for pallor and rash.  Neurological:  Negative for dizziness, tremors, weakness, numbness and headaches.  Psychiatric/Behavioral:  Negative for confusion and sleep disturbance.     Objective:  BP (!) 130/92   Pulse 77   Temp 97.6 F (36.4 C) (Oral)   Ht 5' 2 (1.575 m)   Wt 199 lb 3.2 oz (90.4 kg)   SpO2 97%   BMI 36.43 kg/m   BP Readings from Last 3 Encounters:  12/03/23 (!) 130/92  02/19/23 118/76  08/06/22 (!) 150/83    Wt Readings from Last 3 Encounters:  12/03/23 199 lb 3.2 oz (90.4 kg)  02/19/23 188 lb (85.3 kg)  08/06/22 191 lb 6.4 oz (86.8 kg)    Physical Exam Constitutional:      General: She is not in acute distress.    Appearance: She is well-developed.  HENT:     Head: Normocephalic.     Right Ear: External ear normal.     Left Ear: External ear normal.     Nose: Nose normal.  Eyes:     General:        Right eye: No discharge.        Left eye: No discharge.     Conjunctiva/sclera: Conjunctivae normal.     Pupils:  Pupils are equal, round, and reactive to light.  Neck:     Thyroid : No thyromegaly.     Vascular: No JVD.     Trachea: No tracheal deviation.  Cardiovascular:     Rate and Rhythm: Normal rate and regular rhythm.     Heart sounds: Normal heart sounds.  Pulmonary:     Effort: No respiratory distress.     Breath sounds: No stridor. No wheezing.  Abdominal:     General: Bowel sounds are normal. There is no distension.     Palpations: Abdomen is soft. There is no mass.     Tenderness: There is no abdominal tenderness. There is no guarding or rebound.  Musculoskeletal:        General: Tenderness present.     Cervical back: Normal range of motion and neck supple. No rigidity.  Lymphadenopathy:     Cervical: No  cervical adenopathy.  Skin:    Findings: No erythema or rash.  Neurological:     Mental Status: She is oriented to person, place, and time.     Cranial Nerves: No cranial nerve deficit.     Motor: No abnormal muscle tone.     Coordination: Coordination normal.     Deep Tendon Reflexes: Reflexes normal.  Psychiatric:        Behavior: Behavior normal.        Thought Content: Thought content normal.        Judgment: Judgment normal.   LS w/pain  Lab Results  Component Value Date   WBC 4.7 02/19/2023   HGB 13.9 02/19/2023   HCT 40.7 02/19/2023   PLT 376.0 02/19/2023   GLUCOSE 92 02/19/2023   CHOL 214 (H) 02/19/2023   TRIG 67.0 02/19/2023   HDL 61.20 02/19/2023   LDLDIRECT 133.5 04/28/2013   LDLCALC 140 (H) 02/19/2023   ALT 12 02/19/2023   AST 22 02/19/2023   NA 140 02/19/2023   K 3.6 02/19/2023   CL 105 02/19/2023   CREATININE 0.75 02/19/2023   BUN 17 02/19/2023   CO2 28 02/19/2023   TSH 0.84 02/19/2023   PSA 0.00 (L) 09/24/2017    DG Lumbar Spine Complete Result Date: 09/26/2023 CLINICAL DATA:  Fall, pain EXAM: LUMBAR SPINE - COMPLETE 4+ VIEW COMPARISON:  06/30/2019 FINDINGS: Normal alignment. Anterior and lateral spurring. Disc spaces maintained. Degenerative facet disease in the lower lumbar spine. No fracture. SI joints symmetric and unremarkable. Moderate degenerative facet disease crash that moderate degenerative changes in the hip joints bilaterally, left greater than right. IMPRESSION: Degenerative changes in the lumbar spine and hips as above. No acute bony abnormality. Electronically Signed   By: Franky Crease M.D.   On: 09/26/2023 20:09    Assessment & Plan:   Problem List Items Addressed This Visit     B12 deficiency   Check B12      Essential hypertension   BP nl at home      Hypothyroidism   On Levothroid      LOW BACK PAIN - Primary   LBP s/p fall at work - seeing Dr Beuford In PT Using Aleve         No orders of the defined types were  placed in this encounter.     Follow-up: Return in about 3 months (around 03/03/2024) for a follow-up visit.  Marolyn Noel, MD

## 2023-12-03 NOTE — Assessment & Plan Note (Signed)
 On Levothroid

## 2023-12-03 NOTE — Assessment & Plan Note (Signed)
  BP nl at home 

## 2023-12-03 NOTE — Assessment & Plan Note (Signed)
 Check B12

## 2023-12-03 NOTE — Patient Instructions (Signed)
 USEFUL THINGS FOR ARTHRITIS and musculoskeletal pains:    A rice sock heating pad refers to a homemade heating pad created by filling a sock with uncooked rice, which can be heated in a microwave to provide a warm compress for sore muscles, pain relief, or other applications; essentially, it's a simple way to generate heat using readily available materials.  Key points about rice sock heat: How to make it: Fill a clean sock (preferably a tube sock) about 2/3 full with uncooked rice, tie a knot at the top to secure the rice inside.  Heating it up: Place the rice sock in the microwave and heat in short intervals (usually around 30 seconds at a time) until it reaches the desired warmth.  Important considerations: Check temperature before applying: Always test the temperature of the rice sock before applying it to your skin to avoid burns.  Use a towel to protect skin: Wrap the rice sock in a thin towel to distribute the heat evenly and protect your skin.  Uses: Muscle aches and pains  Menstrual cramps  Neck pain  Arthritis discomfort   BLUE EMU CREAM: Use it 2-3 times a day on painful areas

## 2023-12-03 NOTE — Assessment & Plan Note (Addendum)
 LBP s/p fall at work - seeing Dr Beuford In PT Using Aleve

## 2023-12-09 ENCOUNTER — Ambulatory Visit: Payer: Self-pay | Admitting: Internal Medicine

## 2023-12-12 ENCOUNTER — Other Ambulatory Visit (HOSPITAL_COMMUNITY): Payer: Self-pay

## 2023-12-12 ENCOUNTER — Other Ambulatory Visit: Payer: Self-pay | Admitting: Internal Medicine

## 2023-12-12 ENCOUNTER — Other Ambulatory Visit: Payer: Self-pay

## 2023-12-12 MED ORDER — METHYLPREDNISOLONE 4 MG PO TBPK
ORAL_TABLET | ORAL | 0 refills | Status: DC
  Filled 2023-12-12: qty 21, 6d supply, fill #0

## 2023-12-12 MED ORDER — GABAPENTIN 300 MG PO CAPS
300.0000 mg | ORAL_CAPSULE | Freq: Three times a day (TID) | ORAL | 0 refills | Status: AC
  Filled 2023-12-12: qty 90, 30d supply, fill #0

## 2023-12-15 ENCOUNTER — Other Ambulatory Visit (HOSPITAL_COMMUNITY): Payer: Self-pay

## 2023-12-15 MED ORDER — LEVOTHYROXINE SODIUM 100 MCG PO TABS
100.0000 ug | ORAL_TABLET | Freq: Every day | ORAL | 3 refills | Status: AC
  Filled 2023-12-15: qty 90, 90d supply, fill #0
  Filled 2024-03-16: qty 90, 90d supply, fill #1

## 2023-12-15 MED ORDER — HYDROCHLOROTHIAZIDE 12.5 MG PO CAPS
12.5000 mg | ORAL_CAPSULE | Freq: Every day | ORAL | 3 refills | Status: AC
  Filled 2023-12-15: qty 90, 90d supply, fill #0
  Filled 2024-03-16: qty 90, 90d supply, fill #1

## 2023-12-15 MED ORDER — LOSARTAN POTASSIUM 100 MG PO TABS
100.0000 mg | ORAL_TABLET | Freq: Every day | ORAL | 3 refills | Status: AC
  Filled 2023-12-15: qty 90, 90d supply, fill #0
  Filled 2024-03-16: qty 90, 90d supply, fill #1

## 2023-12-16 ENCOUNTER — Other Ambulatory Visit (HOSPITAL_COMMUNITY): Payer: Self-pay

## 2023-12-19 ENCOUNTER — Other Ambulatory Visit (HOSPITAL_COMMUNITY): Payer: Self-pay

## 2024-01-02 ENCOUNTER — Other Ambulatory Visit (HOSPITAL_COMMUNITY): Payer: Self-pay

## 2024-01-02 MED ORDER — MELOXICAM 15 MG PO TABS
15.0000 mg | ORAL_TABLET | Freq: Every day | ORAL | 0 refills | Status: AC
  Filled 2024-01-02: qty 30, 30d supply, fill #0

## 2024-01-02 MED ORDER — METHYLPREDNISOLONE 4 MG PO TBPK
ORAL_TABLET | ORAL | 0 refills | Status: AC
  Filled 2024-01-02: qty 21, 6d supply, fill #0

## 2024-01-28 ENCOUNTER — Other Ambulatory Visit (HOSPITAL_COMMUNITY): Payer: Self-pay

## 2024-01-30 ENCOUNTER — Other Ambulatory Visit (HOSPITAL_COMMUNITY): Payer: Self-pay

## 2024-01-30 MED ORDER — MELOXICAM 15 MG PO TABS
15.0000 mg | ORAL_TABLET | Freq: Every day | ORAL | 2 refills | Status: AC
  Filled 2024-01-30: qty 60, 60d supply, fill #0

## 2024-02-23 ENCOUNTER — Encounter: Payer: Commercial Managed Care - PPO | Admitting: Internal Medicine

## 2024-02-26 ENCOUNTER — Encounter: Payer: Self-pay | Admitting: Internal Medicine

## 2024-02-26 ENCOUNTER — Ambulatory Visit: Admitting: Internal Medicine

## 2024-02-26 VITALS — BP 132/74 | HR 78 | Ht 62.0 in

## 2024-02-26 DIAGNOSIS — I10 Essential (primary) hypertension: Secondary | ICD-10-CM | POA: Diagnosis not present

## 2024-02-26 DIAGNOSIS — Z0000 Encounter for general adult medical examination without abnormal findings: Secondary | ICD-10-CM | POA: Diagnosis not present

## 2024-02-26 DIAGNOSIS — E559 Vitamin D deficiency, unspecified: Secondary | ICD-10-CM | POA: Diagnosis not present

## 2024-02-26 DIAGNOSIS — E538 Deficiency of other specified B group vitamins: Secondary | ICD-10-CM | POA: Diagnosis not present

## 2024-02-26 DIAGNOSIS — E034 Atrophy of thyroid (acquired): Secondary | ICD-10-CM

## 2024-02-26 DIAGNOSIS — M5440 Lumbago with sciatica, unspecified side: Secondary | ICD-10-CM | POA: Diagnosis not present

## 2024-02-26 LAB — URINALYSIS
Bilirubin Urine: NEGATIVE
Ketones, ur: NEGATIVE
Leukocytes,Ua: NEGATIVE
Nitrite: NEGATIVE
Specific Gravity, Urine: 1.015 (ref 1.000–1.030)
Total Protein, Urine: NEGATIVE
Urine Glucose: NEGATIVE
Urobilinogen, UA: 0.2 (ref 0.0–1.0)
pH: 6.5 (ref 5.0–8.0)

## 2024-02-26 LAB — COMPREHENSIVE METABOLIC PANEL WITH GFR
ALT: 13 U/L (ref 0–35)
AST: 18 U/L (ref 0–37)
Albumin: 4.3 g/dL (ref 3.5–5.2)
Alkaline Phosphatase: 72 U/L (ref 39–117)
BUN: 12 mg/dL (ref 6–23)
CO2: 29 meq/L (ref 19–32)
Calcium: 9.5 mg/dL (ref 8.4–10.5)
Chloride: 104 meq/L (ref 96–112)
Creatinine, Ser: 0.74 mg/dL (ref 0.40–1.20)
GFR: 84.87 mL/min (ref >60.00)
Glucose, Bld: 98 mg/dL (ref 70–99)
Potassium: 3.9 meq/L (ref 3.5–5.1)
Sodium: 141 meq/L (ref 135–145)
Total Bilirubin: 0.7 mg/dL (ref 0.2–1.2)
Total Protein: 7.2 g/dL (ref 6.0–8.3)

## 2024-02-26 LAB — CBC WITH DIFFERENTIAL/PLATELET
Basophils Absolute: 0 K/uL (ref 0.0–0.1)
Basophils Relative: 0.5 % (ref 0.0–3.0)
Eosinophils Absolute: 0.1 K/uL (ref 0.0–0.7)
Eosinophils Relative: 2.2 % (ref 0.0–5.0)
HCT: 41.2 % (ref 36.0–46.0)
Hemoglobin: 13.8 g/dL (ref 12.0–15.0)
Lymphocytes Relative: 44.1 % (ref 12.0–46.0)
Lymphs Abs: 2.3 K/uL (ref 0.7–4.0)
MCHC: 33.6 g/dL (ref 30.0–36.0)
MCV: 86.6 fl (ref 78.0–100.0)
Monocytes Absolute: 0.5 K/uL (ref 0.1–1.0)
Monocytes Relative: 10.2 % (ref 3.0–12.0)
Neutro Abs: 2.3 K/uL (ref 1.4–7.7)
Neutrophils Relative %: 43 % (ref 43.0–77.0)
Platelets: 373 K/uL (ref 150.0–400.0)
RBC: 4.75 Mil/uL (ref 3.87–5.11)
RDW: 13.6 % (ref 11.5–15.5)
WBC: 5.3 K/uL (ref 4.0–10.5)

## 2024-02-26 LAB — LIPID PANEL
Cholesterol: 210 mg/dL — ABNORMAL HIGH (ref 0–200)
HDL: 63.4 mg/dL (ref >39.00)
LDL Cholesterol: 131 mg/dL — ABNORMAL HIGH (ref 0–99)
NonHDL: 146.14
Total CHOL/HDL Ratio: 3
Triglycerides: 78 mg/dL (ref 0.0–149.0)
VLDL: 15.6 mg/dL (ref 0.0–40.0)

## 2024-02-26 LAB — VITAMIN D 25 HYDROXY (VIT D DEFICIENCY, FRACTURES): VITD: 30.83 ng/mL (ref 30.00–100.00)

## 2024-02-26 LAB — TSH: TSH: 4.23 u[IU]/mL (ref 0.35–5.50)

## 2024-02-26 NOTE — Assessment & Plan Note (Signed)
 We discussed age appropriate health related issues, including available/recomended screening tests and vaccinations. Labs were ordered to be later reviewed . All questions were answered. We discussed one or more of the following - seat belt use, use of sunscreen/sun exposure exercise, fall risk reduction, second hand smoke exposure, firearm use and storage, seat belt use, a need for adhering to healthy diet and exercise. Labs were ordered.  All questions were answered. 2022 CT IMPRESSION: No visible coronary artery calcifications. Total coronary calcium score of 0. Cologuard 2023 (-) Shingrix adviced

## 2024-02-26 NOTE — Progress Notes (Signed)
 Subjective:  Patient ID: Krystal Gibbs, female    DOB: May 07, 1960  Age: 63 y.o. MRN: [id number]  CC: Annual Exam (Annual Exam)   HPI Krystal Gibbs presents for a well exam F/u LBP, HTN, hypothyroidism  Outpatient Medications Prior to Visit  Medication Sig Dispense Refill   b complex vitamins tablet Take 1 tablet by mouth daily. 100 tablet 3   Cholecalciferol  (VITAMIN D  PO) Take 1,000 Int'l Units by mouth daily.     cyclobenzaprine  (FLEXERIL ) 5 MG tablet Take 1-2 tablets (5-10 mg total) by mouth at bedtime as needed. 60 tablet 0   gabapentin  (NEURONTIN ) 300 MG capsule Take 1 capsule (300 mg total) by mouth 3 (three) times daily. 90 capsule 0   hydrochlorothiazide  (MICROZIDE ) 12.5 MG capsule Take 1 capsule (12.5 mg total) by mouth daily. 90 capsule 3   levothyroxine  (SYNTHROID ) 100 MCG tablet Take 1 tablet (100 mcg total) by mouth daily. 90 tablet 3   losartan  (COZAAR ) 100 MG tablet Take 1 tablet (100 mg total) by mouth daily. 90 tablet 3   meloxicam  (MOBIC ) 15 MG tablet Take 1 tablet (15 mg total) by mouth daily with food for inflammation START AFTER COMPLETION OF MDP. 60 tablet 0   meloxicam  (MOBIC ) 15 MG tablet Take 1 tablet (15 mg total) by mouth daily for inflammation/pain for 10 days then take 1 tablet as needed 60 tablet 2   methylPREDNISolone  (MEDROL ) 4 MG TBPK tablet TAKE AS PRESCRIBED ON DOSE PACK OVER 6 DAYS 21 tablet 0   meloxicam  (MOBIC ) 15 MG tablet Take 1 tablet (15 mg total) by mouth daily as needed for pain. (Patient not taking: Reported on 02/26/2024) 30 tablet 2   methocarbamol  (ROBAXIN ) 500 MG tablet Take 1 tablet (500 mg total) by mouth every 6 (six) hours as needed, for MUSCLE SPASMS AND PAIN. (Patient not taking: Reported on 02/26/2024) 60 tablet 1   triamcinolone  cream (KENALOG ) 0.1 % Apply 1 application topically 4 (four) times daily. (Patient not taking: Reported on 02/26/2024) 160 g 1   No facility-administered medications prior to visit.    ROS: Review of  Systems  Constitutional:  Negative for activity change, appetite change, chills, fatigue and unexpected weight change.  HENT:  Negative for congestion, mouth sores and sinus pressure.   Eyes:  Negative for visual disturbance.  Respiratory:  Negative for cough and chest tightness.   Gastrointestinal:  Negative for abdominal pain and nausea.  Genitourinary:  Negative for difficulty urinating, frequency and vaginal pain.  Musculoskeletal:  Positive for back pain. Negative for arthralgias and gait problem.  Skin:  Negative for pallor and rash.  Neurological:  Negative for dizziness, tremors, weakness, numbness and headaches.  Psychiatric/Behavioral:  Negative for confusion and sleep disturbance.     Objective:  BP 132/74   Pulse 78   Ht 5' 2 (1.575 m)   SpO2 93%   BMI 36.43 kg/m   BP Readings from Last 3 Encounters:  02/26/24 132/74  12/03/23 (!) 130/92  02/19/23 118/76    Wt Readings from Last 3 Encounters:  12/03/23 199 lb 3.2 oz (90.4 kg)  02/19/23 188 lb (85.3 kg)  08/06/22 191 lb 6.4 oz (86.8 kg)    Physical Exam Constitutional:      General: She is not in acute distress.    Appearance: She is well-developed. She is obese.  HENT:     Head: Normocephalic.     Right Ear: External ear normal.     Left Ear: External ear normal.  Nose: Nose normal.  Eyes:     General:        Right eye: No discharge.        Left eye: No discharge.     Conjunctiva/sclera: Conjunctivae normal.     Pupils: Pupils are equal, round, and reactive to light.  Neck:     Thyroid : No thyromegaly.     Vascular: No JVD.     Trachea: No tracheal deviation.  Cardiovascular:     Rate and Rhythm: Normal rate and regular rhythm.     Heart sounds: Normal heart sounds.  Pulmonary:     Effort: No respiratory distress.     Breath sounds: No stridor. No wheezing.  Abdominal:     General: Bowel sounds are normal. There is no distension.     Palpations: Abdomen is soft. There is no mass.      Tenderness: There is no abdominal tenderness. There is no guarding or rebound.  Musculoskeletal:        General: Tenderness present.     Cervical back: Normal range of motion and neck supple. No rigidity.     Right lower leg: No edema.     Left lower leg: No edema.  Lymphadenopathy:     Cervical: No cervical adenopathy.  Skin:    Findings: No erythema or rash.  Neurological:     Mental Status: She is oriented to person, place, and time.     Cranial Nerves: No cranial nerve deficit.     Motor: No abnormal muscle tone.     Coordination: Coordination normal.     Deep Tendon Reflexes: Reflexes normal.  Psychiatric:        Behavior: Behavior normal.        Thought Content: Thought content normal.        Judgment: Judgment normal.   LS Spine w/pain  Lab Results  Component Value Date   WBC 4.7 02/19/2023   HGB 13.9 02/19/2023   HCT 40.7 02/19/2023   PLT 376.0 02/19/2023   GLUCOSE 90 12/03/2023   CHOL 214 (H) 02/19/2023   TRIG 67.0 02/19/2023   HDL 61.20 02/19/2023   LDLDIRECT 133.5 04/28/2013   LDLCALC 140 (H) 02/19/2023   ALT 13 12/03/2023   AST 15 12/03/2023   NA 140 12/03/2023   K 4.0 12/03/2023   CL 104 12/03/2023   CREATININE 0.73 12/03/2023   BUN 19 12/03/2023   CO2 29 12/03/2023   TSH 0.77 12/03/2023   PSA 0.00 (L) 09/24/2017    DG Lumbar Spine Complete Result Date: 09/26/2023 CLINICAL DATA:  Fall, pain EXAM: LUMBAR SPINE - COMPLETE 4+ VIEW COMPARISON:  06/30/2019 FINDINGS: Normal alignment. Anterior and lateral spurring. Disc spaces maintained. Degenerative facet disease in the lower lumbar spine. No fracture. SI joints symmetric and unremarkable. Moderate degenerative facet disease crash that moderate degenerative changes in the hip joints bilaterally, left greater than right. IMPRESSION: Degenerative changes in the lumbar spine and hips as above. No acute bony abnormality. Electronically Signed   By: Franky Crease M.D.   On: 09/26/2023 20:09    Assessment & Plan:    Problem List Items Addressed This Visit     B12 deficiency   Check B12      Essential hypertension   BP nl at home      Hypothyroidism   On Levothroid      LOW BACK PAIN   Seeing Dr Beuford Handicapped form       Relevant Orders   VITAMIN D   25 Hydroxy (Vit-D Deficiency, Fractures)   Vitamin D  deficiency   Check Vit D level      Well adult exam - Primary   We discussed age appropriate health related issues, including available/recomended screening tests and vaccinations. Labs were ordered to be later reviewed . All questions were answered. We discussed one or more of the following - seat belt use, use of sunscreen/sun exposure exercise, fall risk reduction, second hand smoke exposure, firearm use and storage, seat belt use, a need for adhering to healthy diet and exercise. Labs were ordered.  All questions were answered. 2022 CT IMPRESSION: No visible coronary artery calcifications. Total coronary calcium score of 0. Cologuard 2023 (-) Shingrix adviced      Relevant Orders   TSH   Urinalysis   CBC with Differential/Platelet   Lipid panel   Comprehensive metabolic panel with GFR      No orders of the defined types were placed in this encounter.     Follow-up: Return in about 1 year (around 02/25/2025) for Wellness Exam.  Marolyn Noel, MD

## 2024-02-26 NOTE — Patient Instructions (Signed)

## 2024-02-26 NOTE — Assessment & Plan Note (Signed)
 On Levothroid

## 2024-02-26 NOTE — Assessment & Plan Note (Signed)
  BP nl at home 

## 2024-02-26 NOTE — Assessment & Plan Note (Signed)
 Check B12

## 2024-02-26 NOTE — Assessment & Plan Note (Addendum)
 Seeing Dr Beuford Handicapped form

## 2024-02-26 NOTE — Assessment & Plan Note (Signed)
 Check Vit D level

## 2024-03-01 ENCOUNTER — Ambulatory Visit: Payer: Self-pay | Admitting: Internal Medicine

## 2024-03-03 ENCOUNTER — Ambulatory Visit: Payer: Self-pay | Admitting: Internal Medicine

## 2024-03-16 ENCOUNTER — Other Ambulatory Visit: Payer: Self-pay

## 2024-03-16 ENCOUNTER — Other Ambulatory Visit (HOSPITAL_COMMUNITY): Payer: Self-pay

## 2024-03-17 ENCOUNTER — Telehealth: Payer: Self-pay

## 2024-03-17 ENCOUNTER — Other Ambulatory Visit (HOSPITAL_COMMUNITY): Payer: Self-pay

## 2024-03-17 NOTE — Telephone Encounter (Signed)
 Copied from CRM [id number]. Topic: Clinical - Medication Question >> Mar 16, 2024  4:47 PM Shereese L wrote: Reason for CRM: Pharmacy called and stated that the brand name for the levothyroxine  (SYNTHROID ) 100 MCG tablet to a different one and needed to notify the pcp CB# [phone] hit prescriber option

## 2024-03-19 ENCOUNTER — Other Ambulatory Visit (HOSPITAL_COMMUNITY): Payer: Self-pay

## 2024-03-22 ENCOUNTER — Other Ambulatory Visit (HOSPITAL_COMMUNITY): Payer: Self-pay

## 2024-03-23 ENCOUNTER — Other Ambulatory Visit (HOSPITAL_COMMUNITY): Payer: Self-pay

## 2024-03-26 ENCOUNTER — Other Ambulatory Visit (HOSPITAL_COMMUNITY): Payer: Self-pay

## 2024-04-02 ENCOUNTER — Ambulatory Visit

## 2024-04-02 DIAGNOSIS — Z0000 Encounter for general adult medical examination without abnormal findings: Secondary | ICD-10-CM

## 2024-04-02 NOTE — Progress Notes (Signed)
 "  Chief Complaint  Patient presents with   Medicare Wellness     Subjective:   Krystal Gibbs is a 64 y.o. female who presents for a Medicare Annual Wellness Visit.  Visit info / Clinical Intake: Medicare Wellness Visit Type:: Subsequent Annual Wellness Visit Persons participating in visit and providing information:: patient Medicare Wellness Visit Mode:: Telephone If telephone:: video declined Since this visit was completed virtually, some vitals may be partially provided or unavailable. Missing vitals are due to the limitations of the virtual format.: Unable to obtain vitals - no equipment If Telephone or Video please confirm:: I connected with patient using audio/video enable telemedicine. I verified patient identity with two identifiers, discussed telehealth limitations, and patient agreed to proceed. Patient Location:: Home Provider Location:: Home Interpreter Needed?: No Pre-visit prep was completed: yes AWV questionnaire completed by patient prior to visit?: no Living arrangements:: lives with spouse/significant other Patient's Overall Health Status Rating: very good Typical amount of pain: none Does pain affect daily life?: no Are you currently prescribed opioids?: no  Dietary Habits and Nutritional Risks How many meals a day?: 2 Eats fruit and vegetables daily?: yes Most meals are obtained by: preparing own meals In the last 2 weeks, have you had any of the following?: none Diabetic:: no  Functional Status Activities of Daily Living (to include ambulation/medication): Independent Ambulation: Independent Medication Administration: Independent Home Management (perform basic housework or laundry): Independent Manage your own finances?: yes Primary transportation is: driving Concerns about vision?: no *vision screening is required for WTM* Concerns about hearing?: no  Fall Screening Falls in the past year?: 0 Number of falls in past year: 0 Was there an injury  with Fall?: 0 Fall Risk Category Calculator: 0 Patient Fall Risk Level: Low Fall Risk  Fall Risk Patient at Risk for Falls Due to: No Fall Risks Fall risk Follow up: Falls evaluation completed  Home and Transportation Safety: All rugs have non-skid backing?: yes All stairs or steps have railings?: yes Grab bars in the bathtub or shower?: yes Have non-skid surface in bathtub or shower?: yes Good home lighting?: yes Regular seat belt use?: yes Hospital stays in the last year:: no  Cognitive Assessment Difficulty concentrating, remembering, or making decisions? : no Will 6CIT or Mini Cog be Completed: yes Give patient an address phrase to remember (5 components): [address] [place] About what time is it?: 0 points Count backwards from 20 to 1: 0 points Say the months of the year in reverse: 0 points Repeat the address phrase from earlier: 2 points  Advance Directives (For Healthcare) Does Patient Have a Medical Advance Directive?: No Would patient like information on creating a medical advance directive?: No - Patient declined  Reviewed/Updated  Reviewed/Updated: Reviewed All (Medical, Surgical, Family, Medications, Allergies, Care Teams, Patient Goals)    Allergies (verified) Latex, Penicillins, and Sulfonamide derivatives   Current Medications (verified) Outpatient Encounter Medications as of 04/02/2024  Medication Sig   b complex vitamins tablet Take 1 tablet by mouth daily.   Cholecalciferol  (VITAMIN D  PO) Take 1,000 Int'l Units by mouth daily.   hydrochlorothiazide  (MICROZIDE ) 12.5 MG capsule Take 1 capsule (12.5 mg total) by mouth daily.   levothyroxine  (SYNTHROID ) 100 MCG tablet Take 1 tablet (100 mcg total) by mouth daily.   losartan  (COZAAR ) 100 MG tablet Take 1 tablet (100 mg total) by mouth daily.   cyclobenzaprine  (FLEXERIL ) 5 MG tablet Take 1-2 tablets (5-10 mg total) by mouth at bedtime as needed. (Patient not taking:  Reported on 04/02/2024)   gabapentin   (NEURONTIN ) 300 MG capsule Take 1 capsule (300 mg total) by mouth 3 (three) times daily. (Patient not taking: Reported on 04/02/2024)   meloxicam  (MOBIC ) 15 MG tablet Take 1 tablet (15 mg total) by mouth daily as needed for pain. (Patient not taking: Reported on 04/02/2024)   meloxicam  (MOBIC ) 15 MG tablet Take 1 tablet (15 mg total) by mouth daily with food for inflammation START AFTER COMPLETION OF MDP. (Patient not taking: Reported on 04/02/2024)   meloxicam  (MOBIC ) 15 MG tablet Take 1 tablet (15 mg total) by mouth daily for inflammation/pain for 10 days then take 1 tablet as needed (Patient not taking: Reported on 04/02/2024)   methocarbamol  (ROBAXIN ) 500 MG tablet Take 1 tablet (500 mg total) by mouth every 6 (six) hours as needed, for MUSCLE SPASMS AND PAIN. (Patient not taking: Reported on 02/26/2024)   methylPREDNISolone  (MEDROL ) 4 MG TBPK tablet TAKE AS PRESCRIBED ON DOSE PACK OVER 6 DAYS (Patient not taking: Reported on 04/02/2024)   triamcinolone  cream (KENALOG ) 0.1 % Apply 1 application topically 4 (four) times daily. (Patient not taking: Reported on 02/26/2024)   No facility-administered encounter medications on file as of 04/02/2024.    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   Pain in limb    Rash and other nonspecific skin eruption    Unspecified pruritic disorder    Unspecified vitamin D  deficiency    Past Surgical History:  Procedure Laterality Date   ABDOMINAL HYSTERECTOMY  1999   TAH, ovaries retained   Family History  Problem Relation Age of Onset   Early death Mother    Diabetes Brother    Diabetes  Sister    Coronary artery disease Neg Hx    Thyroid  disease Neg Hx    Social History   Occupational History   Not on file  Tobacco Use   Smoking status: Never   Smokeless tobacco: Never  Vaping Use   Vaping status: Never Used  Substance and Sexual Activity   Alcohol use: No    Alcohol/week: 0.0 standard drinks of alcohol   Drug use: No   Sexual activity: Yes    Partners: Male    Birth control/protection: Surgical    Comment: hysterectomy   Tobacco Counseling Counseling given: Not Answered  SDOH Screenings   Food Insecurity: No Food Insecurity (04/02/2024)  Housing: Low Risk (04/02/2024)  Transportation Needs: No Transportation Needs (04/02/2024)  Utilities: Not At Risk (04/02/2024)  Depression (PHQ2-9): Low Risk (04/02/2024)  Physical Activity: Sufficiently Active (04/02/2024)  Social Connections: Moderately Isolated (04/02/2024)  Stress: No Stress Concern Present (04/02/2024)  Tobacco Use: Low Risk (02/26/2024)  Health Literacy: Adequate Health Literacy (04/02/2024)   See flowsheets for full screening details  Depression Screen PHQ 2 & 9 Depression Scale- Over the past 2 weeks, how often have you been bothered by any of the following problems? Little interest or pleasure in doing things: 0 Feeling down, depressed, or hopeless (PHQ Adolescent also includes...irritable): 0 PHQ-2 Total Score: 0  Goals Addressed             This Visit's Progress    Stay healthy               Objective:    There were no vitals filed for this visit. There is no height or weight on file to calculate BMI.  Hearing/Vision screen No results found. Immunizations and Health Maintenance Health Maintenance  Topic Date Due   Medicare Annual Wellness (AWV)  Never done   HIV Screening  Never done   Hepatitis C Screening  Never done   Pneumococcal Vaccine: 50+ Years (1 of 1 - PCV) Never done   Zoster Vaccines- Shingrix (1 of 2) 09/03/2008   DTaP/Tdap/Td (2 - Tdap) 09/22/2020   COVID-19  Vaccine (4 - 2025-26 season) 11/24/2023   Cervical Cancer Screening (HPV/Pap Cotest)  03/14/2024   Fecal DNA (Cologuard)  01/26/2025   Mammogram  08/14/2025   Influenza Vaccine  Completed   Bone Density Scan  Completed   Hepatitis B Vaccines 19-59 Average Risk  Aged Out   Meningococcal B Vaccine  Aged Out        Assessment/Plan:  This is a routine wellness examination for Leisha.  Patient Care Team: Plotnikov, Karlynn GAILS, MD as PCP - Diedre Robinson Pao, MD (Dermatology) Kassie Mallick, MD (Inactive) (Internal Medicine) Rodgers Barnie RAMAN, CNM as Referring Physician (Certified Nurse Midwife)  I have personally reviewed and noted the following in the patients chart:   Medical and social history Use of alcohol, tobacco or illicit drugs  Current medications and supplements including opioid prescriptions. Functional ability and status Nutritional status Physical activity Advanced directives List of other physicians Hospitalizations, surgeries, and ER visits in previous 12 months Vitals Screenings to include cognitive, depression, and falls Referrals and appointments  No orders of the defined types were placed in this encounter.  In addition, I have reviewed and discussed with patient certain preventive protocols, quality metrics, and best practice recommendations. A written personalized care plan for preventive services as well as general preventive health recommendations were provided to patient.   Arnette LOISE Hoots, CMA   04/02/2024   No follow-ups on file.  After Visit Summary: (Declined) Due to this being a telephonic visit, with patients personalized plan was offered to patient but patient Declined AVS at this time   Nurse Notes: Patient is doing well. Has an appt in December 2026 but would not let me schedule her for a sooner follow up. States she ony has to see him once a year. She states that he told her she was good on Tdap. She is UTD on covid and will discuss  needed vaccines in December.  "

## 2024-04-02 NOTE — Patient Instructions (Signed)
 Krystal Gibbs,  Thank you for taking the time for your Medicare Wellness Visit. I appreciate your continued commitment to your health goals. Please review the care plan we discussed, and feel free to reach out if I can assist you further.  Please note that Annual Wellness Visits do not include a physical exam. Some assessments may be limited, especially if the visit was conducted virtually. If needed, we may recommend an in-person follow-up with your provider.  Ongoing Care Seeing your primary care provider every 3 to 6 months helps us  monitor your health and provide consistent, personalized care.   Referrals If a referral was made during today's visit and you haven't received any updates within two weeks, please contact the referred provider directly to check on the status.  Recommended Screenings:  Health Maintenance  Topic Date Due   Medicare Annual Wellness Visit  Never done   HIV Screening  Never done   Hepatitis C Screening  Never done   Pneumococcal Vaccine for age over 22 (1 of 1 - PCV) Never done   Zoster (Shingles) Vaccine (1 of 2) 09/03/2008   DTaP/Tdap/Td vaccine (2 - Tdap) 09/22/2020   COVID-19 Vaccine (4 - 2025-26 season) 11/24/2023   Pap with HPV screening  03/14/2024   Cologuard (Stool DNA test)  01/26/2025   Breast Cancer Screening  08/14/2025   Flu Shot  Completed   Osteoporosis screening with Bone Density Scan  Completed   Hepatitis B Vaccine  Aged Out   Meningitis B Vaccine  Aged Out       04/02/2024    1:13 PM  Advanced Directives  Does Patient Have a Medical Advance Directive? No  Would patient like information on creating a medical advance directive? No - Patient declined    Vision: Annual vision screenings are recommended for early detection of glaucoma, cataracts, and diabetic retinopathy. These exams can also reveal signs of chronic conditions such as diabetes and high blood pressure.  Dental: Annual dental screenings help detect early signs of oral  cancer, gum disease, and other conditions linked to overall health, including heart disease and diabetes.  Please see the attached documents for additional preventive care recommendations.

## 2025-03-02 ENCOUNTER — Ambulatory Visit: Admitting: Internal Medicine
